# Patient Record
Sex: Male | Born: 1995 | Hispanic: No | Marital: Single | Smoking: Never smoker
Health system: Southern US, Community
[De-identification: ages and names within clinical notes are randomized; demographics above are authoritative.]

## PROBLEM LIST (undated history)

## (undated) HISTORY — PX: TEAR DUCT PROBING: SHX793

---
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
## 2015-05-07 ENCOUNTER — Encounter: Payer: Self-pay | Admitting: Emergency Medicine

## 2015-05-07 ENCOUNTER — Emergency Department
Admission: EM | Admit: 2015-05-07 | Discharge: 2015-05-07 | Disposition: A | Discharge status: Home / Self Care | Payer: 59 | Attending: Emergency Medicine | Admitting: Emergency Medicine

## 2015-05-07 ENCOUNTER — Emergency Department: Payer: 59

## 2015-05-07 DIAGNOSIS — M7989 Other specified soft tissue disorders: Secondary | ICD-10-CM | POA: Diagnosis not present

## 2015-05-07 DIAGNOSIS — Y9367 Activity, basketball: Secondary | ICD-10-CM | POA: Insufficient documentation

## 2015-05-07 DIAGNOSIS — S99912A Unspecified injury of left ankle, initial encounter: Secondary | ICD-10-CM | POA: Diagnosis present

## 2015-05-07 DIAGNOSIS — W1849XA Other slipping, tripping and stumbling without falling, initial encounter: Secondary | ICD-10-CM | POA: Diagnosis not present

## 2015-05-07 DIAGNOSIS — Y998 Other external cause status: Secondary | ICD-10-CM | POA: Insufficient documentation

## 2015-05-07 DIAGNOSIS — Y92310 Basketball court as the place of occurrence of the external cause: Secondary | ICD-10-CM | POA: Diagnosis not present

## 2015-05-07 DIAGNOSIS — S93402A Sprain of unspecified ligament of left ankle, initial encounter: Secondary | ICD-10-CM | POA: Insufficient documentation

## 2015-05-07 MED ORDER — NAPROXEN 500 MG PO TABS: 500.0000 mg | ORAL_TABLET | Freq: Two times a day (BID) | ORAL | Status: DC

## 2015-05-07 NOTE — Discharge Instructions (Signed)
Ankle Sprain °An ankle sprain is an injury to the strong, fibrous tissues (ligaments) that hold the bones of your ankle joint together.  °CAUSES °An ankle sprain is usually caused by a fall or by twisting your ankle. Ankle sprains most commonly occur when you step on the outer edge of your foot, and your ankle turns inward. People who participate in sports are more prone to these types of injuries.  °SYMPTOMS  °· Pain in your ankle. The pain may be present at rest or only when you are trying to stand or walk. °· Swelling. °· Bruising. Bruising may develop immediately or within 1 to 2 days after your injury. °· Difficulty standing or walking, particularly when turning corners or changing directions. °DIAGNOSIS  °Your caregiver will ask you details about your injury and perform a physical exam of your ankle to determine if you have an ankle sprain. During the physical exam, your caregiver will press on and apply pressure to specific areas of your foot and ankle. Your caregiver will try to move your ankle in certain ways. An X-ray exam may be done to be sure a bone was not broken or a ligament did not separate from one of the bones in your ankle (avulsion fracture).  °TREATMENT  °Certain types of braces can help stabilize your ankle. Your caregiver can make a recommendation for this. Your caregiver may recommend the use of medicine for pain. If your sprain is severe, your caregiver may refer you to a surgeon who helps to restore function to parts of your skeletal system (orthopedist) or a physical therapist. °HOME CARE INSTRUCTIONS  °· Apply ice to your injury for 1-2 days or as directed by your caregiver. Applying ice helps to reduce inflammation and pain. °· Put ice in a plastic bag. °· Place a towel between your skin and the bag. °· Leave the ice on for 15-20 minutes at a time, every 2 hours while you are awake. °· Only take over-the-counter or prescription medicines for pain, discomfort, or fever as directed by  your caregiver. °· Elevate your injured ankle above the level of your heart as much as possible for 2-3 days. °· If your caregiver recommends crutches, use them as instructed. Gradually put weight on the affected ankle. Continue to use crutches or a cane until you can walk without feeling pain in your ankle. °· If you have a plaster splint, wear the splint as directed by your caregiver. Do not rest it on anything harder than a pillow for the first 24 hours. Do not put weight on it. Do not get it wet. You may take it off to take a shower or bath. °· You may have been given an elastic bandage to wear around your ankle to provide support. If the elastic bandage is too tight (you have numbness or tingling in your foot or your foot becomes cold and blue), adjust the bandage to make it comfortable. °· If you have an air splint, you may blow more air into it or let air out to make it more comfortable. You may take your splint off at night and before taking a shower or bath. Wiggle your toes in the splint several times per day to decrease swelling. °SEEK MEDICAL CARE IF:  °· You have rapidly increasing bruising or swelling. °· Your toes feel extremely cold or you lose feeling in your foot. °· Your pain is not relieved with medicine. °SEEK IMMEDIATE MEDICAL CARE IF: °· Your toes are numb or blue. °·   You have severe pain that is increasing. MAKE SURE YOU:   Understand these instructions.  Will watch your condition.  Will get help right away if you are not doing well or get worse.   This information is not intended to replace advice given to you by your health care provider. Make sure you discuss any questions you have with your health care provider.   Document Released: 02/17/2005 Document Revised: 03/10/2014 Document Reviewed: 03/01/2011 Elsevier Interactive Patient Education 2016 Elsevier Inc.  Stirrup Ankle Brace Stirrup ankle braces give support and help stabilize the ankle joint. They are rigid pieces of  plastic or fiberglass that go up both sides of the lower leg with the bottom of the stirrup fitting comfortably under the bottom of the instep of the foot. It can be held on with Velcro straps or an elastic wrap. Stirrup ankle braces are used to support the ankle following mild or moderate sprains or strains, or fractures after cast removal.  They can be easily removed or adjusted if there is swelling. The rigid brace shells are designed to fit the ankle comfortably and provide the needed medial/lateral stabilization. This brace can be easily worn with most athletic shoes. The brace liner is usually made of a soft, comfortable gel-like material. This gel fits the ankle well without causing uncomfortable pressure points.  IMPORTANCE OF ANKLE BRACES:  The use of ankle bracing is effective in the prevention of ankle sprains.  In athletes, the use of ankle bracing will offer protection and prevent further sprains.  Research shows that a complete rehabilitation program needs to be included with external bracing. This includes range of motion and ankle strengthening exercises. Your caregivers will instruct you in this. If you were given the brace today for a new injury, use the following home care instructions as a guide. HOME CARE INSTRUCTIONS   Apply ice to the sore area for 15-20 minutes, 03-04 times per day while awake for the first 2 days. Put the ice in a plastic bag and place a towel between the bag of ice and your skin. Never place the ice pack directly on your skin. Be especially careful using ice on an elbow or knee or other bony area, such as your ankle, because icing for too long may damage the nerves which are close to the surface.  Keep your leg elevated when possible to lessen swelling.  Wear your splint until you are seen for a follow-up examination. Do not put weight on it. Do not get it wet. You may take it off to take a shower or bath.  For Activity: Use crutches with non-weight  bearing for 1 week. Then, you may walk on your ankle as instructed. Start gradually with weight bearing on the affected ankle.  Continue to use crutches or a cane until you can stand on your ankle without causing pain.  Wiggle your toes in the splint several times per day if you are able.  The splint is too tight if you have numbness, tingling, or if your foot becomes cold and blue. Adjust the straps or elastic bandage to make it comfortable.  Only take over-the-counter or prescription medicines for pain, discomfort, or fever as directed by your caregiver. SEEK IMMEDIATE MEDICAL CARE IF:   You have increased bruising, swelling or pain.  Your toes are blue or cold and loosening the brace or wrap does not help.  Your pain is not relieved with medicine. MAKE SURE YOU:   Understand these instructions.  Will watch your condition.  Will get help right away if you are not doing well or get worse.   This information is not intended to replace advice given to you by your health care provider. Make sure you discuss any questions you have with your health care provider.   Document Released: 12/19/2003 Document Revised: 05/12/2011 Document Reviewed: 09/19/2014 Elsevier Interactive Patient Education 2016 Elsevier Inc.  Adult nurselastic Bandage and RICE WHAT DOES AN ELASTIC BANDAGE DO? Elastic bandages come in different shapes and sizes. They generally provide support to your injury and reduce swelling while you are healing, but they can perform different functions. Your health care provider will help you to decide what is best for your protection, recovery, or rehabilitation following an injury. WHAT ARE SOME GENERAL TIPS FOR USING AN ELASTIC BANDAGE?  Use the bandage as directed by the maker of the bandage that you are using.  Do not wrap the bandage too tightly. This may cut off the circulation in the arm or leg in the area below the bandage.  If part of your body beyond the bandage becomes blue,  numb, cold, swollen, or is more painful, your bandage is most likely too tight. If this occurs, remove your bandage and reapply it more loosely.  See your health care provider if the bandage seems to be making your problems worse rather than better.  An elastic bandage should be removed and reapplied every 3-4 hours or as directed by your health care provider. WHAT IS RICE? The routine care of many injuries includes rest, ice, compression, and elevation (RICE therapy).  Rest Rest is required to allow your body to heal. Generally, you can resume your routine activities when you are comfortable and have been given permission by your health care provider. Ice Icing your injury helps to keep the swelling down and it reduces pain. Do not apply ice directly to your skin.  Put ice in a plastic bag.  Place a towel between your skin and the bag.  Leave the ice on for 20 minutes, 2-3 times per day. Do this for as long as you are directed by your health care provider. Compression Compression helps to keep swelling down, gives support, and helps with discomfort. Compression may be done with an elastic bandage. Elevation Elevation helps to reduce swelling and it decreases pain. If possible, your injured area should be placed at or above the level of your heart or the center of your chest. WHEN SHOULD I SEEK MEDICAL CARE? You should seek medical care if:  You have persistent pain and swelling.  Your symptoms are getting worse rather than improving. These symptoms may indicate that further evaluation or further X-rays are needed. Sometimes, X-rays may not show a small broken bone (fracture) until a number of days later. Make a follow-up appointment with your health care provider. Ask when your X-ray results will be ready. Make sure that you get your X-ray results. WHEN SHOULD I SEEK IMMEDIATE MEDICAL CARE? You should seek immediate medical care if:  You have a sudden onset of severe pain at or below  the area of your injury.  You develop redness or increased swelling around your injury.  You have tingling or numbness at or below the area of your injury that does not improve after you remove the elastic bandage.   This information is not intended to replace advice given to you by your health care provider. Make sure you discuss any questions you have with your health care provider.  Document Released: 08/09/2001 Document Revised: 11/08/2014 Document Reviewed: 10/03/2013 Elsevier Interactive Patient Education [employer]

## 2015-05-07 NOTE — ED Notes (Signed)
See triage   States he slipped rolled his left ankle today  Pain and swelling noted   Positive pulses   Good sensation

## 2015-05-07 NOTE — ED Provider Notes (Signed)
[facility] Emergency Department Provider Note  ____________________________________________  Time seen: Approximately 4:57 PM  I have reviewed the triage vital signs and the nursing notes.   HISTORY  Chief Complaint Ankle Pain    HPI William Humphrey is a 20 y.o. male who presents emergency department complaining of left ankle pain. Patient states that he was playing basketball when he "rolled his ankle." Patient is endorsing pain to the lateral aspect of his ankle with swelling. He denies any other injury or complaints at this time. He denies any numbness or tingling at the distal aspect of foot.   History reviewed. No pertinent past medical history.  There are no active problems to display for this patient.   No past surgical history on file.  Current Outpatient Rx  Name  Route  Sig  Dispense  Refill  . naproxen (NAPROSYN) 500 MG tablet   Oral   Take 1 tablet (500 mg total) by mouth 2 (two) times daily with a meal.   60 tablet   0     Allergies Review of patient's allergies indicates no known allergies.  No family history on file.  Social History Social History  Substance Use Topics  . Smoking status: Never Smoker   . Smokeless tobacco: None  . Alcohol Use: None     Review of Systems  Constitutional: No fever/chills Musculoskeletal: Negative for back pain. Positive for left ankle pain. Skin: Negative for rash. Neurological: Negative for headaches, focal weakness or numbness. 10-point ROS otherwise negative.  ____________________________________________   PHYSICAL EXAM:  VITAL SIGNS: ED Triage Vitals  Enc Vitals Group     BP 05/07/15 1558 129/63 mmHg     Pulse Rate 05/07/15 1558 67     Resp 05/07/15 1558 16     Temp 05/07/15 1558 98.2 F (36.8 C)     Temp Source 05/07/15 1558 Oral     SpO2 05/07/15 1558 97 %     Weight 05/07/15 1558 217 lb (98.431 kg)     Height 05/07/15 1558 6\' 4"  (1.93 m)     Head Cir --      Peak  Flow --      Pain Score 05/07/15 1600 0     Pain Loc --      Pain Edu? --      Excl. in GC? --      Constitutional: Alert and oriented. Well appearing and in no acute distress. Eyes: Conjunctivae are normal. PERRL. EOMI. Head: Atraumatic. Cardiovascular: Normal rate, regular rhythm. Normal S1 and S2.  Good peripheral circulation. Respiratory: Normal respiratory effort without tachypnea or retractions. Lungs CTAB. Musculoskeletal: Edema noted to the lateral aspect of the left ankle. No visible deformity. Full range of motion to ankle. Patient is nontender to palpation over the lateral malleolus. Dorsalis pedis pulses appreciated. Sensation intact 5 digits. Neurologic:  Normal speech and language. No gross focal neurologic deficits are appreciated.  Skin:  Skin is warm, dry and intact. No rash noted. Psychiatric: Mood and affect are normal. Speech and behavior are normal. Patient exhibits appropriate insight and judgement.   ____________________________________________   LABS (all labs ordered are listed, but only abnormal results are displayed)  Labs Reviewed - No data to display ____________________________________________  EKG   ____________________________________________  RADIOLOGY Festus BarrenI, Jonathan D Cuthriell, personally viewed and evaluated these images (plain radiographs) as part of my medical decision making, as well as reviewing the written report by the radiologist.  Dg Ankle Complete Left  05/07/2015  CLINICAL DATA:  Lateral ankle pain and swelling following twisting injury playing basketball yesterday. EXAM: LEFT ANKLE COMPLETE - 3+ VIEW COMPARISON:  None. FINDINGS: The mineralization and alignment are normal. There is no evidence of acute fracture or dislocation. The joint spaces are preserved. There is moderate lateral soft tissue swelling with a probable ankle joint effusion. IMPRESSION: No acute osseous findings.  Lateral soft tissue swelling. Electronically Signed   By:  Carey Bullocks M.D.   On: 05/07/2015 16:24    ____________________________________________    PROCEDURES  Procedure(s) performed:       Medications - No data to display   ____________________________________________   INITIAL IMPRESSION / ASSESSMENT AND PLAN / ED COURSE  Pertinent labs & imaging results that were available during my care of the patient were reviewed by me and considered in my medical decision making (see chart for details).  Patient's diagnosis is consistent with left ankle sprain. Patient will be discharged home with prescriptions for anti-inflammatories for symptom control. Patient is advised to use a stirrup ankle brace or playing sports in the future.. Patient is to follow up with orthopedics if symptoms persist past this treatment course. Patient is given ED precautions to return to the ED for any worsening or new symptoms.     ____________________________________________  FINAL CLINICAL IMPRESSION(S) / ED DIAGNOSES  Final diagnoses:  Ankle sprain, left, initial encounter      NEW MEDICATIONS STARTED DURING THIS VISIT:  New Prescriptions   NAPROXEN (NAPROSYN) 500 MG TABLET    Take 1 tablet (500 mg total) by mouth 2 (two) times daily with a meal.        This chart was dictated using voice recognition software/Dragon. Despite best efforts to proofread, errors can occur which can change the meaning. Any change was purely unintentional.    Racheal Patches, PA-C 05/07/15 1718  Jeanmarie Plant, MD 05/07/15 2011

## 2015-05-07 NOTE — ED Notes (Signed)
Pt states he slipped and twisted his left ankle today. Pt ambulatory to triage without difficulty.

## 2015-05-07 NOTE — ED Notes (Signed)
Left ankle pain.  Onset of symptoms yesterday.  Patient states "rolled ankle yesterday while playing basketball".

## 2015-09-18 DIAGNOSIS — Z0000 Encounter for general adult medical examination without abnormal findings: Secondary | ICD-10-CM | POA: Diagnosis not present
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
## 2016-05-07 ENCOUNTER — Ambulatory Visit (INDEPENDENT_AMBULATORY_CARE_PROVIDER_SITE_OTHER): Payer: 59

## 2016-05-07 ENCOUNTER — Encounter (INDEPENDENT_AMBULATORY_CARE_PROVIDER_SITE_OTHER): Payer: Self-pay | Admitting: Orthopedic Surgery

## 2016-05-07 ENCOUNTER — Ambulatory Visit (INDEPENDENT_AMBULATORY_CARE_PROVIDER_SITE_OTHER): Payer: 59 | Admitting: Orthopedic Surgery

## 2016-05-07 DIAGNOSIS — G8929 Other chronic pain: Secondary | ICD-10-CM

## 2016-05-07 DIAGNOSIS — M25512 Pain in left shoulder: Secondary | ICD-10-CM

## 2016-05-07 NOTE — Progress Notes (Signed)
Office Visit Note   Patient: William Humphrey           Date of Birth: 11/17/1995           MRN: [id number] Visit Date: 05/07/2016 Requested by: No referring provider defined for this encounter. PCP: Leanna SatoMILES,LINDA M, MD  Subjective: Chief Complaint  Patient presents with  . 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  He thinks it "separated" after playing basketball 3 years ago.  Patient states the shoulder feels unstable.  It hurts when he is playing basketball.  If he gets fouled or bumped it feels loose.  He reports pain circumferentially around   No issues with working out.  He is right-hand-dominant.  He is okay with bench press.              ROS: All systems reviewed are negative as they relate to the chief complaint within the history of present illness.  Patient denies  fevers or chills.   Assessment & Plan: Visit Diagnoses:  1. Chronic left shoulder pain     Plan: impression is left shoulder symptomatic instability with radiographic evidence of possible reverse bony [place].  He's not ligamentously that loose on exam.  This does not look like multidirectional instability when comparing the left and right shoulders.we need an MRI arthrogram of that left shoulder to evaluate for possible labral pathology.  I'll see him back after that study.  Follow-Up Instructions: Return for after MRI.   Orders:  Orders Placed This Encounter  Procedures  . XR Shoulder Left  . MR Shoulder Left w/ contrast  . Arthrogram   No orders of the defined types were placed in this encounter.     Procedures: No procedures performed   Clinical Data: No additional findings.  Objective: Vital Signs: There were no vitals taken for this visit.  Physical Exam:   Constitutional: Patient appears well-developed HEENT:  Head: Normocephalic Eyes:EOM are normal Neck: Normal range of  motion Cardiovascular: Normal rate Pulmonary/chest: Effort normal Neurologic: Patient is alert Skin: Skin is warm Psychiatric: Patient has normal mood and affect    Ortho Exam: orthopedic exam demonstrates full active and passive motion of the left shoulder and right shoulder with less than a centimeter sulcus sign bilaterally.  Cervical spine range of motion is full.  I really detect much in the way of anterior or posterior apprehension.  No course grinding or crepitus on labral testing left versus right.  O'Brien's testing negative on the left.  I can't really generate much posterior apprehension with adduction and forward flexion and internal rotation  Specialty Comments:  No specialty comments available.  Imaging: Xr Shoulder Left  Result Date: 05/07/2016 Left shoulder AP outlet and axillary reviewed.  There are humeral joint is reduced.  The visualized lung fields clear.  Acromiohumeral distance maintained.  Acromioclavicular joint normal.  There is calcification in the posterior inferior glenoid noted on both the AP and axillary views.    PMFS History: There are no active problems to display for this patient.  No past medical history on file.  No family history on file.  No past surgical history on file. Social History   Occupational History  . Not on file.   Social History Main Topics  . Smoking status: Never Smoker  . Smokeless tobacco: Never Used  . Alcohol use Not on file  . Drug use: Unknown  .  Sexual activity: Not on file

## 2016-05-09 DIAGNOSIS — S43432A Superior glenoid labrum lesion of left shoulder, initial encounter: Secondary | ICD-10-CM | POA: Diagnosis not present

## 2016-05-09 DIAGNOSIS — M25412 Effusion, left shoulder: Secondary | ICD-10-CM | POA: Diagnosis not present

## 2016-05-09 DIAGNOSIS — M25512 Pain in left shoulder: Secondary | ICD-10-CM | POA: Diagnosis not present

## 2016-05-09 DIAGNOSIS — M958 Other specified acquired deformities of musculoskeletal system: Secondary | ICD-10-CM | POA: Diagnosis not present

## 2016-05-09 DIAGNOSIS — M7542 Impingement syndrome of left shoulder: Secondary | ICD-10-CM | POA: Diagnosis not present

## 2016-05-14 ENCOUNTER — Telehealth (INDEPENDENT_AMBULATORY_CARE_PROVIDER_SITE_OTHER): Payer: Self-pay | Admitting: Orthopedic Surgery

## 2016-05-14 NOTE — Telephone Encounter (Signed)
Pt requested a call back to discuss MRI results.  [phone]

## 2016-05-14 NOTE — Telephone Encounter (Signed)
Spoke with patient explained Dr August Saucerean out of office until Monday. He said that he lives out of town and was told by Dr August Saucerean that he could be called with results since he lives out of town. Please advise.

## 2016-05-14 NOTE — Telephone Encounter (Signed)
Pt requested

## 2016-05-20 ENCOUNTER — Telehealth (INDEPENDENT_AMBULATORY_CARE_PROVIDER_SITE_OTHER): Payer: Self-pay | Admitting: *Deleted

## 2016-05-20 NOTE — Telephone Encounter (Signed)
Patient's mother called in this morning in regards to needing her son's MRI results. He has not heard from anyone and he would like to have these results today please if possible. His mother's CB is # [phone]. Thank you

## 2016-05-20 NOTE — Telephone Encounter (Signed)
Apologized for delay in getting results.  I advised would have Dr August Saucerean call tomorrow.

## 2016-05-21 NOTE — Telephone Encounter (Signed)
I called patient he has what appears to be an operative problem in the shoulder.  Continues to have issues with instability.  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  I did tell him that that he does have a problem which will likely require surgery in order to achieve shoulder stability.  Trade off on that I explained would be a possibility of shoulder stiffness to some degree.

## 2016-05-28 ENCOUNTER — Telehealth (INDEPENDENT_AMBULATORY_CARE_PROVIDER_SITE_OTHER): Payer: Self-pay | Admitting: Orthopedic Surgery

## 2016-05-28 NOTE — Telephone Encounter (Signed)
Spoke with patients mom and she is anxious about getting patient scheduled for surgery while he is out for summer.  Dr August Saucerean ok with patient being scheduled for June. Will give info to sheet to Taren to call and schedule for surgery.

## 2016-05-28 NOTE — Telephone Encounter (Signed)
Patient mother William Ro(Deneen) called needing to know what Dr August Saucerean want to do next for William Humphrey. She asked if Dr August Saucerean want to get [facility] scheduled for surgery or see him in the office first. Patient will be in town the last weekend in May. The number to contact Deneen is [phone]

## 2016-06-03 ENCOUNTER — Other Ambulatory Visit (INDEPENDENT_AMBULATORY_CARE_PROVIDER_SITE_OTHER): Payer: Self-pay | Admitting: Orthopedic Surgery

## 2016-06-03 DIAGNOSIS — M24112 Other articular cartilage disorders, left shoulder: Secondary | ICD-10-CM

## 2016-07-29 ENCOUNTER — Encounter (HOSPITAL_COMMUNITY): Payer: Self-pay

## 2016-07-29 ENCOUNTER — Encounter (HOSPITAL_COMMUNITY)
Admission: RE | Admit: 2016-07-29 | Discharge: 2016-07-29 | Disposition: A | Discharge status: Home / Self Care | Payer: 59 | Source: Ambulatory Visit | Attending: Orthopedic Surgery | Admitting: Orthopedic Surgery

## 2016-07-29 DIAGNOSIS — Z01812 Encounter for preprocedural laboratory examination: Secondary | ICD-10-CM | POA: Insufficient documentation

## 2016-07-29 DIAGNOSIS — X58XXXA Exposure to other specified factors, initial encounter: Secondary | ICD-10-CM | POA: Insufficient documentation

## 2016-07-29 DIAGNOSIS — S43402A Unspecified sprain of left shoulder joint, initial encounter: Secondary | ICD-10-CM | POA: Diagnosis not present

## 2016-07-29 LAB — CBC
HCT: 44.2 % (ref 39.0–52.0)
Hemoglobin: 15.2 g/dL (ref 13.0–17.0)
MCH: 29.9 pg (ref 26.0–34.0)
MCHC: 34.4 g/dL (ref 30.0–36.0)
MCV: 86.8 fL (ref 78.0–100.0)
Platelets: 232 10*3/uL (ref 150–400)
RBC: 5.09 MIL/uL (ref 4.22–5.81)
RDW: 12.4 % (ref 11.5–15.5)
WBC: 5.1 10*3/uL (ref 4.0–10.5)

## 2016-07-29 LAB — BASIC METABOLIC PANEL
Anion gap: 7 (ref 5–15)
BUN: 11 mg/dL (ref 6–20)
CO2: 29 mmol/L (ref 22–32)
Calcium: 9.5 mg/dL (ref 8.9–10.3)
Chloride: 103 mmol/L (ref 101–111)
Creatinine, Ser: 1.11 mg/dL (ref 0.61–1.24)
GFR calc Af Amer: >60 mL/min (ref >60)
GFR calc non Af Amer: >60 mL/min (ref >60)
Glucose, Bld: 98 mg/dL (ref 65–99)
Potassium: 3.8 mmol/L (ref 3.5–5.1)
Sodium: 139 mmol/L (ref 135–145)

## 2016-07-29 NOTE — Progress Notes (Signed)
ZOX:WRUEAPCP:Linda Marvis MoellerMiles, MD  Cardiologist: pt denies  EKG: pt denies ever  Stress test: pt denies ever  ECHO: pt denies ever  Cardiac Cath: pt denies ever  Chest x-ray: pt denies past year

## 2016-07-29 NOTE — Pre-Procedure Instructions (Signed)
William Humphrey  07/29/2016      [id number] - [place], [place] - [address] [place] [address] Phone: [phone] Fax: [phone]    Your procedure is scheduled on August 05, 2016.  Report to [facility] Admitting at 530 AM.  Call this number if you have problems the morning of surgery:  [phone]   Remember:  Do not eat food or drink liquids after midnight.  Take these medicines the morning of surgery with A SIP OF WATER -none.  7 days prior to surgery STOP taking any Aspirin, Aleve, Naproxen, Ibuprofen, Motrin, Advil, Goody's, BC's, all herbal medications, fish oil, and all vitamins   Do not wear jewelry, make-up or nail polish.  Do not wear lotions, powders, or perfumes, or deoderant.             Men may shave face and neck.  Do not bring valuables to the hospital.  [facility] is not responsible for any belongings or valuables.  Contacts, dentures or bridgework may not be worn into surgery.  Leave your suitcase in the car.  After surgery it may be brought to your room.  For patients admitted to the hospital, discharge time will be determined by your treatment team.  Patients discharged the day of surgery will not be allowed to drive home.   Special instructions:   - Preparing For Surgery  Before surgery, you can play an important role. Because skin is not sterile, your skin needs to be as free of germs as possible. You can reduce the number of germs on your skin by washing with CHG (chlorahexidine gluconate) Soap before surgery.  CHG is an antiseptic cleaner which kills germs and bonds with the skin to continue killing germs even after washing.  Please do not use if you have an allergy to CHG or antibacterial soaps. If your skin becomes reddened/irritated stop using the CHG.  Do not shave (including legs and underarms) for at least 48 hours prior to first CHG shower. It is OK to shave your face.  Please  follow these instructions carefully.   1. Shower the NIGHT BEFORE SURGERY and the MORNING OF SURGERY with CHG.   2. If you chose to wash your hair, wash your hair first as usual with your normal shampoo.  3. After you shampoo, rinse your hair and body thoroughly to remove the shampoo.  4. Use CHG as you would any other liquid soap. You can apply CHG directly to the skin and wash gently with a scrungie or a clean washcloth.   5. Apply the CHG Soap to your body ONLY FROM THE NECK DOWN.  Do not use on open wounds or open sores. Avoid contact with your eyes, ears, mouth and genitals (private parts). Wash genitals (private parts) with your normal soap.  6. Wash thoroughly, paying special attention to the area where your surgery will be performed.  7. Thoroughly rinse your body with warm water from the neck down.  8. DO NOT shower/wash with your normal soap after using and rinsing off the CHG Soap.  9. Pat yourself dry with a CLEAN TOWEL.   10. Wear CLEAN PAJAMAS   11. Place CLEAN SHEETS on your bed the night of your first shower and DO NOT SLEEP WITH PETS.    Day of Surgery: Do not apply any deodorants/lotions. Please wear clean clothes to the hospital/surgery center.     Please read  over the following fact sheets that you were given. Pain Booklet, Coughing and Deep Breathing, MRSA Information and Surgical Site Infection Prevention

## 2016-08-04 ENCOUNTER — Encounter (HOSPITAL_COMMUNITY): Payer: Self-pay | Admitting: Anesthesiology

## 2016-08-04 ENCOUNTER — Ambulatory Visit (INDEPENDENT_AMBULATORY_CARE_PROVIDER_SITE_OTHER): Payer: 59 | Admitting: Orthopedic Surgery

## 2016-08-04 ENCOUNTER — Encounter (INDEPENDENT_AMBULATORY_CARE_PROVIDER_SITE_OTHER): Payer: Self-pay | Admitting: Orthopedic Surgery

## 2016-08-04 DIAGNOSIS — M25512 Pain in left shoulder: Secondary | ICD-10-CM | POA: Diagnosis not present

## 2016-08-04 MED ORDER — CEFAZOLIN SODIUM-DEXTROSE 2-4 GM/100ML-% IV SOLN
2.0000 g | INTRAVENOUS | Status: AC
  Administered 2016-08-05: 2 g via INTRAVENOUS
  Filled 2016-08-04: qty 100

## 2016-08-04 NOTE — H&P (Signed)
William Humphrey is an 21 y.o. male.   Chief Complaint: Left shoulder pain and instability HPI: William Humphrey is a 21 year old patient with left shoulder pain and instability of several years duration.  Initially he sustained what sounds like an anterior dislocation which was reduced by his coach.  Since that time he's had trouble with episodic instability in the shoulder slipping into and out of joint when he is been playing basketball.  He describes no further episodes where the shoulder had to be reduced but he has been having pain with range of motion for several hours after the subluxation episode.  He has been able to lift weights.  He has not done so in several months since his season ended.  He still has several seasons left in college.  There is no family history of DVT or pulmonary embolism.  No past medical history on file.  Past Surgical History:  Procedure Laterality Date  . TOOTH EXTRACTION  2008    No family history on file. Social History:  reports that he has never smoked. He has never used smokeless tobacco. He reports that he does not drink alcohol or use drugs.  Allergies:  Allergies  Allergen Reactions  . No Known Allergies     No prescriptions prior to admission.    No results found for this or any previous visit (from the past 48 hour(s)). No results found.  Review of Systems  Musculoskeletal: Positive for joint pain.  All other systems reviewed and are negative.   There were no vitals taken for this visit. Physical Exam  Constitutional: He appears well-developed.  HENT:  Head: Normocephalic.  Eyes: Pupils are equal, round, and reactive to light.  Neck: Normal range of motion.  Cardiovascular: Normal rate.   Respiratory: Effort normal.  Neurological: He is alert.  Skin: Skin is warm.  Psychiatric: He has a normal mood and affect.   Examination of the left shoulder demonstrates full active and passive range of motion with external rotation at 15 of abduction  to about 70.  Not much in way of positive apprehension or relocation testing either anteriorly or posteriorly.  He does have some laxity to the shoulder a little bit more in the posterior direction.  There is one episode of coarseness on examination with posterior stress.  O'Brien's testing equivocal on the left negative on the right.  Cervical spine range of motion full.  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  Initial injury occurred with abduction and external rotation and full forward flexion on a rebound several years ago.  His coach reduced it.  Since that time he's had several episodes of instability when he is been playing basketball.  MRI scan shows anterior as well as posterior labral pathology along with a SLAP tear.  Plan is arthroscopy with labral repair.  Risks and benefits discussed with the patient including not limited to infection shoulder stiffness potential for recurrent dislocation as well as some loss of motion which may be the trade-off for a stable shoulder.  Patient understands the risk and benefits and wishes to proceed.  All questions answered  Burnard BuntingG Scott Ellakate Gonsalves, MD 08/04/2016, 5:14 PM

## 2016-08-04 NOTE — Anesthesia Preprocedure Evaluation (Addendum)
Anesthesia Evaluation  Patient identified by MRN, date of birth, ID band Patient awake    Reviewed: Allergy & Precautions, NPO status , Patient's Chart, lab work & pertinent test results  Airway Mallampati: I  TM Distance: >3 FB Neck ROM: Full    Dental  (+) Chipped, Dental Advisory Given, Teeth Intact,    Pulmonary neg pulmonary ROS,    breath sounds clear to auscultation       Cardiovascular negative cardio ROS   Rhythm:Regular Rate:Normal     Neuro/Psych negative neurological ROS  negative psych ROS   GI/Hepatic negative GI ROS, Neg liver ROS,   Endo/Other  negative endocrine ROS  Renal/GU negative Renal ROS  negative genitourinary   Musculoskeletal negative musculoskeletal ROS (+)   Abdominal   Peds negative pediatric ROS (+)  Hematology negative hematology ROS (+)   Anesthesia Other Findings Day of surgery medications reviewed with the patient.  Reproductive/Obstetrics negative OB ROS                           Anesthesia Physical Anesthesia Plan  ASA: I  Anesthesia Plan: General   Post-op Pain Management:  Regional for Post-op pain   Induction: Intravenous  PONV Risk Score and Plan: 2 and Ondansetron, Dexamethasone, Treatment may vary due to age, Midazolam and Propofol  Airway Management Planned: Oral ETT  Additional Equipment:   Intra-op Plan:   Post-operative Plan: Extubation in OR  Informed Consent: I have reviewed the patients History and Physical, chart, labs and discussed the procedure including the risks, benefits and alternatives for the proposed anesthesia with the patient or authorized representative who has indicated his/her understanding and acceptance.     Plan Discussed with:   Anesthesia Plan Comments:         Anesthesia Quick Evaluation

## 2016-08-05 ENCOUNTER — Ambulatory Visit (HOSPITAL_COMMUNITY): Payer: 59 | Admitting: Certified Registered"

## 2016-08-05 ENCOUNTER — Ambulatory Visit (HOSPITAL_COMMUNITY)
Admission: RE | Admit: 2016-08-05 | Discharge: 2016-08-05 | Disposition: A | Discharge status: Home / Self Care | Payer: 59 | Source: Ambulatory Visit | Attending: Orthopedic Surgery | Admitting: Orthopedic Surgery

## 2016-08-05 ENCOUNTER — Encounter (HOSPITAL_COMMUNITY): Payer: Self-pay | Admitting: *Deleted

## 2016-08-05 ENCOUNTER — Encounter (HOSPITAL_COMMUNITY)
Admission: RE | Disposition: A | Discharge status: Home / Self Care | Payer: Self-pay | Source: Ambulatory Visit | Attending: Orthopedic Surgery

## 2016-08-05 DIAGNOSIS — Y9367 Activity, basketball: Secondary | ICD-10-CM | POA: Insufficient documentation

## 2016-08-05 DIAGNOSIS — S43432A Superior glenoid labrum lesion of left shoulder, initial encounter: Secondary | ICD-10-CM | POA: Insufficient documentation

## 2016-08-05 DIAGNOSIS — S43492A Other sprain of left shoulder joint, initial encounter: Secondary | ICD-10-CM | POA: Diagnosis present

## 2016-08-05 DIAGNOSIS — S43432D Superior glenoid labrum lesion of left shoulder, subsequent encounter: Secondary | ICD-10-CM | POA: Diagnosis not present

## 2016-08-05 DIAGNOSIS — M24112 Other articular cartilage disorders, left shoulder: Secondary | ICD-10-CM

## 2016-08-05 DIAGNOSIS — G8918 Other acute postprocedural pain: Secondary | ICD-10-CM | POA: Diagnosis not present

## 2016-08-05 HISTORY — PX: SHOULDER ARTHROSCOPY WITH LABRAL REPAIR: SHX5691

## 2016-08-05 SURGERY — ARTHROSCOPY, SHOULDER, WITH GLENOID LABRUM REPAIR
Anesthesia: General | Site: Shoulder | Laterality: Left

## 2016-08-05 MED ORDER — DEXAMETHASONE SODIUM PHOSPHATE 10 MG/ML IJ SOLN
INTRAMUSCULAR | Status: AC
  Filled 2016-08-05: qty 1

## 2016-08-05 MED ORDER — DIPHENHYDRAMINE HCL 50 MG/ML IJ SOLN
INTRAMUSCULAR | Status: DC | PRN
  Administered 2016-08-05: 25 mg via INTRAVENOUS

## 2016-08-05 MED ORDER — FENTANYL CITRATE (PF) 250 MCG/5ML IJ SOLN
INTRAMUSCULAR | Status: AC
  Filled 2016-08-05: qty 5

## 2016-08-05 MED ORDER — FENTANYL CITRATE (PF) 250 MCG/5ML IJ SOLN
INTRAMUSCULAR | Status: DC | PRN
  Administered 2016-08-05 (×3): 50 ug via INTRAVENOUS

## 2016-08-05 MED ORDER — CHLORHEXIDINE GLUCONATE 4 % EX LIQD: 60.0000 mL | Freq: Once | CUTANEOUS | Status: DC

## 2016-08-05 MED ORDER — PROPOFOL 10 MG/ML IV BOLUS
INTRAVENOUS | Status: AC
  Filled 2016-08-05: qty 40

## 2016-08-05 MED ORDER — MEPERIDINE HCL 25 MG/ML IJ SOLN: 6.2500 mg | INTRAMUSCULAR | Status: DC | PRN

## 2016-08-05 MED ORDER — SUGAMMADEX SODIUM 200 MG/2ML IV SOLN
INTRAVENOUS | Status: AC
  Filled 2016-08-05: qty 2

## 2016-08-05 MED ORDER — ONDANSETRON HCL 4 MG/2ML IJ SOLN
INTRAMUSCULAR | Status: AC
  Filled 2016-08-05: qty 2

## 2016-08-05 MED ORDER — BUPIVACAINE HCL (PF) 0.25 % IJ SOLN
INTRAMUSCULAR | Status: AC
  Filled 2016-08-05: qty 30

## 2016-08-05 MED ORDER — SODIUM CHLORIDE 0.9 % IR SOLN
Status: DC | PRN
  Administered 2016-08-05: 4 mL

## 2016-08-05 MED ORDER — SUGAMMADEX SODIUM 200 MG/2ML IV SOLN
INTRAVENOUS | Status: DC | PRN
  Administered 2016-08-05: 189.6 mg via INTRAVENOUS

## 2016-08-05 MED ORDER — PROPOFOL 10 MG/ML IV BOLUS
INTRAVENOUS | Status: DC | PRN
Start: 2016-08-05 — End: 2016-08-05
  Administered 2016-08-05: 150 mg via INTRAVENOUS
  Administered 2016-08-05: 50 mg via INTRAVENOUS
  Administered 2016-08-05: 200 mg via INTRAVENOUS
  Administered 2016-08-05: 50 mg via INTRAVENOUS

## 2016-08-05 MED ORDER — SODIUM CHLORIDE 0.9 % IR SOLN
Status: DC | PRN
  Administered 2016-08-05: 12000 mL
  Administered 2016-08-05: 6000 mL

## 2016-08-05 MED ORDER — HYDROMORPHONE HCL 1 MG/ML IJ SOLN: 0.2500 mg | INTRAMUSCULAR | Status: DC | PRN

## 2016-08-05 MED ORDER — EPINEPHRINE PF 1 MG/ML IJ SOLN
INTRAMUSCULAR | Status: AC
  Filled 2016-08-05: qty 2

## 2016-08-05 MED ORDER — MIDAZOLAM HCL 2 MG/2ML IJ SOLN
INTRAMUSCULAR | Status: AC
  Filled 2016-08-05: qty 2

## 2016-08-05 MED ORDER — ONDANSETRON HCL 4 MG/2ML IJ SOLN
INTRAMUSCULAR | Status: DC | PRN
  Administered 2016-08-05: 4 mg via INTRAVENOUS

## 2016-08-05 MED ORDER — EPINEPHRINE PF 1 MG/ML IJ SOLN
INTRAMUSCULAR | Status: AC
  Filled 2016-08-05: qty 3

## 2016-08-05 MED ORDER — SUCCINYLCHOLINE CHLORIDE 200 MG/10ML IV SOSY
PREFILLED_SYRINGE | INTRAVENOUS | Status: DC | PRN
  Administered 2016-08-05: 140 mg via INTRAVENOUS

## 2016-08-05 MED ORDER — BUPIVACAINE-EPINEPHRINE (PF) 0.5% -1:200000 IJ SOLN
INTRAMUSCULAR | Status: AC
  Filled 2016-08-05: qty 30

## 2016-08-05 MED ORDER — EPINEPHRINE PF 1 MG/ML IJ SOLN
INTRAMUSCULAR | Status: AC
  Filled 2016-08-05: qty 1

## 2016-08-05 MED ORDER — DEXAMETHASONE SODIUM PHOSPHATE 10 MG/ML IJ SOLN
INTRAMUSCULAR | Status: DC | PRN
  Administered 2016-08-05: 10 mg via INTRAVENOUS

## 2016-08-05 MED ORDER — BUPIVACAINE-EPINEPHRINE (PF) 0.25% -1:200000 IJ SOLN
INTRAMUSCULAR | Status: AC
  Filled 2016-08-05: qty 30

## 2016-08-05 MED ORDER — LACTATED RINGERS IV SOLN
INTRAVENOUS | Status: DC | PRN
  Administered 2016-08-05 (×2): via INTRAVENOUS

## 2016-08-05 MED ORDER — BUPIVACAINE-EPINEPHRINE (PF) 0.5% -1:200000 IJ SOLN
INTRAMUSCULAR | Status: DC | PRN
  Administered 2016-08-05: 20 mL via PERINEURAL

## 2016-08-05 MED ORDER — LACTATED RINGERS IV SOLN: INTRAVENOUS | Status: DC

## 2016-08-05 MED ORDER — LIDOCAINE 2% (20 MG/ML) 5 ML SYRINGE
INTRAMUSCULAR | Status: AC
  Filled 2016-08-05: qty 5

## 2016-08-05 MED ORDER — PROMETHAZINE HCL 25 MG/ML IJ SOLN: 6.2500 mg | INTRAMUSCULAR | Status: DC | PRN

## 2016-08-05 MED ORDER — MIDAZOLAM HCL 5 MG/5ML IJ SOLN
INTRAMUSCULAR | Status: DC | PRN
  Administered 2016-08-05 (×2): 2 mg via INTRAVENOUS

## 2016-08-05 MED ORDER — PROPOFOL 10 MG/ML IV BOLUS
INTRAVENOUS | Status: AC
  Filled 2016-08-05: qty 20

## 2016-08-05 MED ORDER — ROCURONIUM BROMIDE 10 MG/ML (PF) SYRINGE
PREFILLED_SYRINGE | INTRAVENOUS | Status: DC | PRN
  Administered 2016-08-05: 55 mg via INTRAVENOUS
  Administered 2016-08-05 (×4): 10 mg via INTRAVENOUS
  Administered 2016-08-05: 5 mg via INTRAVENOUS

## 2016-08-05 MED ORDER — LIDOCAINE HCL (CARDIAC) 20 MG/ML IV SOLN
INTRAVENOUS | Status: DC | PRN
  Administered 2016-08-05: 60 mg via INTRAVENOUS

## 2016-08-05 MED ORDER — ROCURONIUM BROMIDE 10 MG/ML (PF) SYRINGE
PREFILLED_SYRINGE | INTRAVENOUS | Status: AC
  Filled 2016-08-05: qty 5

## 2016-08-05 SURGICAL SUPPLY — 63 items
ANCH SUT SHRT 12.5 CANN EYLT (Anchor) ×6 IMPLANT
ANCHOR SUT BIOCOMP LK 2.9X12.5 (Anchor) ×6 IMPLANT
BLADE CUTTER GATOR 3.5 (BLADE) ×1 IMPLANT
BLADE GREAT WHITE 4.2 (BLADE) ×1 IMPLANT
BLADE SURG 11 STRL SS (BLADE) ×1 IMPLANT
CANNULA 5.75X71 LONG (CANNULA) ×4 IMPLANT
CANNULA TWIST IN 8.25X7CM (CANNULA) ×2 IMPLANT
COVER SURGICAL LIGHT HANDLE (MISCELLANEOUS) ×2 IMPLANT
DRAPE INCISE IOBAN 66X45 STRL (DRAPES) ×2 IMPLANT
DRAPE STERI 35X30 U-POUCH (DRAPES) ×4 IMPLANT
DRAPE U-SHAPE 47X51 STRL (DRAPES) ×2 IMPLANT
DRSG AQUACEL AG ADV 3.5X 4 (GAUZE/BANDAGES/DRESSINGS) ×2 IMPLANT
DRSG TEGADERM 4X4.75 (GAUZE/BANDAGES/DRESSINGS) IMPLANT
DURAPREP 26ML APPLICATOR (WOUND CARE) ×2 IMPLANT
ELECT REM PT RETURN 9FT ADLT (ELECTROSURGICAL)
ELECTRODE REM PT RTRN 9FT ADLT (ELECTROSURGICAL) ×1 IMPLANT
FIBERSTICK 2 (SUTURE) ×1 IMPLANT
GAUZE SPONGE 4X4 12PLY STRL (GAUZE/BANDAGES/DRESSINGS) ×2 IMPLANT
GAUZE SPONGE 4X4 12PLY STRL LF (GAUZE/BANDAGES/DRESSINGS) ×1 IMPLANT
GAUZE XEROFORM 1X8 LF (GAUZE/BANDAGES/DRESSINGS) ×2 IMPLANT
GLOVE BIOGEL M 6.5 STRL (GLOVE) ×5 IMPLANT
GLOVE BIOGEL M 7.0 STRL (GLOVE) ×5 IMPLANT
GLOVE BIOGEL PI IND STRL 8 (GLOVE) ×1 IMPLANT
GLOVE BIOGEL PI INDICATOR 8 (GLOVE) ×1
GLOVE SURG ORTHO 8.0 STRL STRW (GLOVE) ×2 IMPLANT
GOWN STRL REUS W/ TWL LRG LVL3 (GOWN DISPOSABLE) ×3 IMPLANT
GOWN STRL REUS W/TWL LRG LVL3 (GOWN DISPOSABLE) ×6
KIT BASIN OR (CUSTOM PROCEDURE TRAY) ×2 IMPLANT
KIT DISPOSABLE PUSHLOCK 2.9MM (KITS) IMPLANT
KIT PUSHLOCK 2.9 HIP (KITS) ×1 IMPLANT
KIT ROOM TURNOVER OR (KITS) ×2 IMPLANT
LASSO 90 CVE QUICKPAS (DISPOSABLE) ×1 IMPLANT
LASSO CRESCENT QUICKPASS (SUTURE) ×2 IMPLANT
MANIFOLD NEPTUNE II (INSTRUMENTS) ×2 IMPLANT
NDL SPNL 18GX3.5 QUINCKE PK (NEEDLE) ×1 IMPLANT
NEEDLE SPNL 18GX3.5 QUINCKE PK (NEEDLE) ×4 IMPLANT
NS IRRIG 1000ML POUR BTL (IV SOLUTION) ×2 IMPLANT
PACK SHOULDER (CUSTOM PROCEDURE TRAY) ×2 IMPLANT
PAD ARMBOARD 7.5X6 YLW CONV (MISCELLANEOUS) ×4 IMPLANT
SET ARTHROSCOPY TUBING (MISCELLANEOUS) ×2
SET ARTHROSCOPY TUBING LN (MISCELLANEOUS) ×1 IMPLANT
SLEEVE ARM SUSPENSION SYSTEM (MISCELLANEOUS) ×1 IMPLANT
SLING ARM IMMOBILIZER MED (SOFTGOODS) IMPLANT
SLING S3 LATERAL DISP (MISCELLANEOUS) ×1 IMPLANT
SPONGE LAP 4X18 X RAY DECT (DISPOSABLE) ×2 IMPLANT
SUT 0 FIBERLOOP 38 BLUE TPR ND (SUTURE) ×4
SUT ETHILON 3 0 PS 1 (SUTURE) ×5 IMPLANT
SUT FIBERWIRE 2-0 18 17.9 3/8 (SUTURE)
SUT VIC AB 2-0 CT1 27 (SUTURE) ×4
SUT VIC AB 2-0 CT1 TAPERPNT 27 (SUTURE) IMPLANT
SUT VIC AB 3-0 X1 27 (SUTURE) ×2 IMPLANT
SUTURE 0 FIBERLP 38 BLU TPR ND (SUTURE) IMPLANT
SUTURE FIBERWR 2-0 18 17.9 3/8 (SUTURE) IMPLANT
SYR 10ML LL (SYRINGE) ×1 IMPLANT
SYR 20CC LL (SYRINGE) ×2 IMPLANT
SYR 30ML LL (SYRINGE) ×2 IMPLANT
TAPE LABRALWHITE 1.5X36 (TAPE) ×6 IMPLANT
TAPE SUT LABRALTAP WHT/BLK (SUTURE) IMPLANT
TOWEL OR 17X24 6PK STRL BLUE (TOWEL DISPOSABLE) ×2 IMPLANT
TOWEL OR 17X26 10 PK STRL BLUE (TOWEL DISPOSABLE) ×2 IMPLANT
WAND HAND CNTRL MULTIVAC 90 (MISCELLANEOUS) IMPLANT
WAND STAR VAC 90 (SURGICAL WAND) ×1 IMPLANT
WATER STERILE IRR 1000ML POUR (IV SOLUTION) ×2 IMPLANT

## 2016-08-05 NOTE — Transfer of Care (Signed)
Immediate Anesthesia Transfer of Care Note  Patient: William Humphrey  Procedure(s) Performed: Procedure(s): LEFT SHOULDER ARTHROSCOPY WITH LABRAL REPAIR (Left)  Patient Location: PACU  Anesthesia Type:General  Level of Consciousness: oriented, drowsy and patient cooperative  Airway & Oxygen Therapy: Patient Spontanous Breathing and Patient connected to face mask oxygen  Post-op Assessment: Report given to RN and Post -op Vital signs reviewed and stable  Post vital signs: stable  Last Vitals:  Vitals:   08/05/16 1124 08/05/16 1127  BP: (!) (P) 155/86 (!) 155/86  Pulse:  (!) 102  Resp:    Temp: (P) 37 C 37 C    Last Pain:  Vitals:   08/05/16 1127  TempSrc: Oral         Complications: No apparent anesthesia complications

## 2016-08-05 NOTE — Op Note (Signed)
NAME:  William Humphrey              ACCOUNT NO.:  [id number]  MEDICAL RECORD NO.:  [id number]  LOCATION:                                 FACILITY:  PHYSICIAN:  Burnard BuntingG. Scott Fortunata Betty, M.D.         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  PROCEDURE:  Left shoulder arthroscopy with anteroinferior labral repair and posterior labral repair.  SURGEON:  Burnard BuntingG. Scott Tarron Krolak, M.D.  ASSISTANT:  Patrick Jupiterarla Bethune, RNFA.  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  He sustained anterior dislocation a couple of years ago.  He has had symptomatic instability since that time.  MRI scan shows anteroinferior labral stripping as well as posterior labral tear.  He presents now for operative management after explanation of risks and benefits.  OPERATIVE FINDINGS: 1. Examination under anesthesia, range of motion, the patient had     about 60 degrees of external rotation at 15 degrees abduction.  He     had less than a centimeter sulcus sign bilaterally.  He did have 2+     anterior and 2+ posterior instability on the left-hand side     compared to 1+ anterior, 1+ posterior on the right-hand side.  I     could not fully dislocate him anteriorly, but he did have more     laxity anteriorly in the shoulder on the left than compared to the     right. 2. Diagnostic and operative arthroscopy:     a.     Tear of the anterior-inferior labrum with healing medially      off the anterior-inferior glenoid face.     b.     Stable anterior-superior labrum.     c.     Tear of the posterior labrum extending from the 1 o'clock      position to the 5 o'clock position in the left glenoid.     d.     Intact rotator cuff.  PROCEDURE IN DETAIL:  The patient was brought to the  operating room where general anesthetic was induced.  Preoperative antibiotics were administered.  Time-out was called.  The patient's shoulders were examined under anesthesia with the above findings.  The patient was then placed in the lateral position with the right peroneal nerve and right axilla well padded.  Arm, shoulder, and hand prescrubbed with alcohol and Betadine, allowed to air dry, prepped with DuraPrep solution and draped in a sterile manner.  Time-out was called.  The arm was then suspended with 15 pounds of traction in slight forward flexion and 45 degrees of abduction.  Superior traction also utilized in order to facilitate visualization.  At this time, posterior portal was created about 2.5 cm inferior to the posterolateral margin of the acromion.  The scope was placed.  Two anterior portals were then placed under direct visualization.  Diagnostic arthroscopy demonstrated labral detachment and medialization of the anterior band of the inferior glenohumeral ligament from the  6 o'clock to 9 o'clock position.  There was also detachment of the posterior labrum, which was more degenerative appearing in nature from the 1 o'clock to 5 o'clock position.  The anterior labrum was addressed first.  Using periosteal elevator, the plane between the previously torn anterior labrum was developed between that tissue and the glenoid.  Following development of this plane and mobilization of the tissue back onto the glenoid face, the glenoid neck was prepared using a smooth shaver and a ball rasp.  At this time, beginning inferior to posterior, 3 anchors were placed at the 7, 8, and 9 o'clock positions with good mobilization and reattachment of the labral tissue to the face.  This was done with Arthrex SutureTape and 2.9 knotless suture anchors.  Following this, attention was directed towards the posterior labrum.  A second accessory portal was created to create good drilling angle.  Three  anchors were then placed at the 1 o'clock, 3 o'clock, and 5 o'clock position after preparing the glenoid rim with a ball rasp and smooth shaver.  This gave secure fixation of the posterior labrum.  At this time, thorough irrigation of the joint was performed.  Instruments were removed, and the 4 portals were closed using 2-0 Vicryl and 3-0 nylon.  The patient was then placed into a shoulder immobilizer.  He tolerated procedure well without immediate complications, and transferred to the recovery room in stable condition.     Burnard Bunting, M.D.   ______________________________ Reece Agar. Dorene Grebe, M.D.    GSD/MEDQ  D:  08/05/2016  T:  08/05/2016  Job:  [id number]

## 2016-08-05 NOTE — Brief Op Note (Signed)
08/05/2016  11:23 AM  PATIENT:  Arcadio A Sadiq  21 y.o. male  PRE-OPERATIVE DIAGNOSIS:  LEFT SHOULDER LABRAL TEAR  POST-OPERATIVE DIAGNOSIS:  LEFT SHOULDER LABRAL TEAR  PROCEDURE:  Procedure(s): LEFT SHOULDER ARTHROSCOPY WITH LABRAL REPAIR  SURGEON:  Surgeon(s): August Saucerean, Corrie MckusickScott Gregory, MD  ASSISTANT: Patrick Jupiterarla Bethune rnfa  ANESTHESIA:   general  EBL: 5 ml    Total I/O In: 1000 [I.V.:1000] Out: -   BLOOD ADMINISTERED: none  DRAINS: none   LOCAL MEDICATIONS USED:  none  SPECIMEN:  No Specimen  COUNTS:  YES  TOURNIQUET:  * No tourniquets in log *  DICTATION: .Other Dictation: Dictation Number [id number]  PLAN OF CARE: Discharge to home after PACU  PATIENT DISPOSITION:  PACU - hemodynamically stable

## 2016-08-05 NOTE — Anesthesia Postprocedure Evaluation (Signed)
Anesthesia Post Note  Patient: William Humphrey  Procedure(s) Performed: Procedure(s) (LRB): LEFT SHOULDER ARTHROSCOPY WITH LABRAL REPAIR (Left)     Patient location during evaluation: PACU Anesthesia Type: General and Regional Level of consciousness: awake and alert Pain management: pain level controlled Vital Signs Assessment: post-procedure vital signs reviewed and stable Respiratory status: spontaneous breathing, nonlabored ventilation, respiratory function stable and patient connected to nasal cannula oxygen Cardiovascular status: blood pressure returned to baseline and stable Postop Assessment: no signs of nausea or vomiting Anesthetic complications: no    Last Vitals:  Vitals:   08/05/16 1145 08/05/16 1204  BP:  (!) 144/86  Pulse: 83 78  Resp: 20 20  Temp:      Last Pain:  Vitals:   08/05/16 1127  TempSrc: Oral                 Shelton SilvasKevin D Polly Barner

## 2016-08-05 NOTE — Anesthesia Procedure Notes (Signed)
Anesthesia Regional Block: Interscalene brachial plexus block   Pre-Anesthetic Checklist: ,, timeout performed, Correct Patient, Correct Site, Correct Laterality, Correct Procedure, Correct Position, site marked, Risks and benefits discussed,  Surgical consent,  Pre-op evaluation,  At surgeon's request and post-op pain management  Laterality: Left  Prep: chloraprep       Needles:  Injection technique: Single-shot  Needle Type: Echogenic Needle     Needle Length: 9cm  Needle Gauge: 21     Additional Needles:   Procedures: ultrasound guided,,,,,,,,  Narrative:  Start time: 08/05/2016 7:10 AM End time: 08/05/2016 7:20 AM Injection made incrementally with aspirations every 5 mL.  Performed by: Personally  Anesthesiologist: Shona SimpsonHOLLIS, Altovise Wahler D  Additional Notes: Tolerated well. No parethesias. Pt communicating throughout procedure.

## 2016-08-05 NOTE — Anesthesia Procedure Notes (Signed)
Procedure Name: Intubation Date/Time: 08/05/2016 7:56 AM Performed by: Freddie Breech Pre-anesthesia Checklist: Patient identified, Emergency Drugs available, Suction available and Patient being monitored Patient Re-evaluated:Patient Re-evaluated prior to inductionOxygen Delivery Method: Circle System Utilized Preoxygenation: Pre-oxygenation with 100% oxygen Intubation Type: IV induction Ventilation: Mask ventilation without difficulty Laryngoscope Size: Mac and 4 Grade View: Grade II Tube type: Oral Number of attempts: 1 Airway Equipment and Method: Stylet and Oral airway Placement Confirmation: ETT inserted through vocal cords under direct vision,  positive ETCO2 and breath sounds checked- equal and bilateral Secured at: 23 cm Tube secured with: Tape Dental Injury: Teeth and Oropharynx as per pre-operative assessment

## 2016-08-05 NOTE — Interval H&P Note (Signed)
History and Physical Interval Note:  08/05/2016 7:40 AM  William Humphrey  has presented today for surgery, with the diagnosis of LEFT SHOULDER LABRAL TEAR  The various methods of treatment have been discussed with the patient and family. After consideration of risks, benefits and other options for treatment, the patient has consented to  Procedure(s): LEFT SHOULDER ARTHROSCOPY WITH LABRAL REPAIR (Left) as a surgical intervention .  The patient's history has been reviewed, patient examined, no change in status, stable for surgery.  I have reviewed the patient's chart and labs.  Questions were answered to the patient's satisfaction.     Burnard BuntingG Scott Taylon Louison

## 2016-08-06 NOTE — Progress Notes (Signed)
   Office Visit Note   Patient: William Humphrey           Date of Birth: 06/15/1995           MRN: [id number] Visit Date: 08/04/2016 Requested by: Leanna SatoMiles, Linda M, MD [address] [place], [place] [address] PCP: Leanna SatoMiles, Linda M, MD  Subjective: Chief Complaint  Patient presents with  . Left Shoulder - Follow-up    HPI: William Humphrey is a 21 year old patient with left shoulder instability.  He had left shoulder dislocation about 2 years ago which was put back in by his coach.  He's been playing with shoulder subluxation since that time.  He has been able to manage it fairly well.  He's had an MRI scan done at an outside facility which shows both anterior and posterior labral pathology.  Rotator cuff is intact.  He has enough pain in the shoulder that he wants to proceed with surgical intervention.  No family history of DVT or pulmonary embolism              ROS: All systems reviewed are negative as they relate to the chief complaint within the history of present illness.  Patient denies  fevers or chills.   Assessment & Plan: Visit Diagnoses:  1. Left shoulder pain, unspecified chronicity     Plan: Impression is left shoulder instability which by history appears to be primarily anterior.  He does have some laxity on exam today but no asymmetric sulcus sign.  On both sides that is less than a centimeter.  Plan is arthroscopy with labral stabilization.  Risk and benefits are discussed with strengthening including not limited to infection or vessel damage as well as the trade-off between shoulder stability and stiffness.  He may have some loss of motion.  Patient understands the risks and benefits and wishes to proceed.  All questions answered  Follow-Up Instructions: No Follow-up on file.   Orders:  No orders of the defined types were placed in this encounter.  No orders of the defined types were placed in this encounter.     Procedures: No procedures performed   Clinical Data: No  additional findings.  Objective: Vital Signs: There were no vitals taken for this visit.  Physical Exam:   Constitutional: Patient appears well-developed HEENT:  Head: Normocephalic Eyes:EOM are normal Neck: Normal range of motion Cardiovascular: Normal rate Pulmonary/chest: Effort normal Neurologic: Patient is alert Skin: Skin is warm Psychiatric: Patient has normal mood and affect    Ortho Exam: Orthopedic exam demonstrates full forward flexion and abduction with external rotation on the left to about 60.  Motor sensory function of the hand is intact.  Cervical spine range of motion is full.  Does have more positive apprehension anteriorly than posteriorly.  O'Brien's testing equivocal on the  left negative on the right  Specialty Comments:  No specialty comments available.  Imaging: No results found.   PMFS History: There are no active problems to display for this patient.  No past medical history on file.  No family history on file.  Past Surgical History:  Procedure Laterality Date  . TOOTH EXTRACTION  2008   Social History   Occupational History  . Not on file.   Social History Main Topics  . Smoking status: Never Smoker  . Smokeless tobacco: Never Used  . Alcohol use No  . Drug use: No  . Sexual activity: Not on file

## 2016-08-07 ENCOUNTER — Encounter (HOSPITAL_COMMUNITY): Payer: Self-pay | Admitting: Orthopedic Surgery

## 2016-08-11 ENCOUNTER — Ambulatory Visit (INDEPENDENT_AMBULATORY_CARE_PROVIDER_SITE_OTHER): Payer: 59 | Admitting: Orthopedic Surgery

## 2016-08-11 ENCOUNTER — Encounter (INDEPENDENT_AMBULATORY_CARE_PROVIDER_SITE_OTHER): Payer: Self-pay | Admitting: Orthopedic Surgery

## 2016-08-11 DIAGNOSIS — M25312 Other instability, left shoulder: Secondary | ICD-10-CM

## 2016-08-13 NOTE — Progress Notes (Signed)
   Post-Op Visit Note   Patient: William Humphrey           Date of Birth: 09/03/1995           MRN: [id number] Visit Date: 08/11/2016 PCP: Leanna SatoMiles, Linda M, MD   Assessment & Plan:  Chief Complaint:  Chief Complaint  Patient presents with  . Left Shoulder - Follow-up, Routine Post Op   Visit Diagnoses: No diagnosis found.  Plan: Treatment is a 21 year old patient week out left shoulder arthroscopy with anterior and posterior labral repair.  He's been doing well.  Taking oxycodone for pain.  On exam deltoid fires in the shoulder feel stable.  No coarseness with passive range of motion.  Ligamentously sling today that fits him better 2 week return.  Okay at this point to do pendulum exercises 1 time a day only.  We'll see him back in 2 weeks we'll get him started in therapy for below shoulder level range of motion.  Follow-Up Instructions: Return in about 2 weeks (around 08/25/2016).   Orders:  No orders of the defined types were placed in this encounter.  No orders of the defined types were placed in this encounter.   Imaging: No results found.  PMFS History: There are no active problems to display for this patient.  No past medical history on file.  No family history on file.  Past Surgical History:  Procedure Laterality Date  . SHOULDER ARTHROSCOPY WITH LABRAL REPAIR Left 08/05/2016   Procedure: LEFT SHOULDER ARTHROSCOPY WITH LABRAL REPAIR;  Surgeon: Cammy Copaean, Scott Sarena Jezek, MD;  [facility];  Service: Orthopedics;  Laterality: Left;  . TOOTH EXTRACTION  2008   Social History   Occupational History  . Not on file.   Social History Main Topics  . Smoking status: Never Smoker  . Smokeless tobacco: Never Used  . Alcohol use No  . Drug use: No  . Sexual activity: Not on file

## 2016-08-25 ENCOUNTER — Ambulatory Visit (INDEPENDENT_AMBULATORY_CARE_PROVIDER_SITE_OTHER): Payer: 59 | Admitting: Orthopedic Surgery

## 2016-08-25 ENCOUNTER — Encounter (INDEPENDENT_AMBULATORY_CARE_PROVIDER_SITE_OTHER): Payer: Self-pay | Admitting: Orthopedic Surgery

## 2016-08-25 DIAGNOSIS — Z9889 Other specified postprocedural states: Secondary | ICD-10-CM

## 2016-08-27 NOTE — Progress Notes (Signed)
   Post-Op Visit Note   Patient: William Humphrey           Date of Birth: 01/07/1996           MRN: [id number] Visit Date: 08/25/2016 PCP: Leanna SatoMiles, Linda M, MD   Assessment & Plan:  Chief Complaint:  Chief Complaint  Patient presents with  . Left Shoulder - Routine Post Op   Visit Diagnoses:  1. Status post labral repair of shoulder     Plan: Treatment is now about 3 weeks out left shoulder arthroscopy with labral repair.  He's doing exercises at home.  Hasn't started formal physical therapy yet.  On exam his shoulder feel stable.  I like to keep him in the sling for another 3 weeks but start him on range of motion up to 90 of abduction and 90 of forward flexion for the next 3 weeks.  After that we can increase his range of motion to more overhead motion.  For now he just needs 3 more weeks to let the soft tissue healing to the glenoid.  Follow-Up Instructions: Return in about 3 weeks (around 09/15/2016).   Orders:  No orders of the defined types were placed in this encounter.  No orders of the defined types were placed in this encounter.   Imaging: No results found.  PMFS History: There are no active problems to display for this patient.  No past medical history on file.  No family history on file.  Past Surgical History:  Procedure Laterality Date  . SHOULDER ARTHROSCOPY WITH LABRAL REPAIR Left 08/05/2016   Procedure: LEFT SHOULDER ARTHROSCOPY WITH LABRAL REPAIR;  Surgeon: Cammy Copaean, Scott Orvin Netter, MD;  [facility];  Service: Orthopedics;  Laterality: Left;  . TOOTH EXTRACTION  2008   Social History   Occupational History  . Not on file.   Social History Main Topics  . Smoking status: Never Smoker  . Smokeless tobacco: Never Used  . Alcohol use No  . Drug use: No  . Sexual activity: Not on file

## 2016-09-08 ENCOUNTER — Ambulatory Visit: Payer: 59 | Attending: Specialist

## 2016-09-08 DIAGNOSIS — M25612 Stiffness of left shoulder, not elsewhere classified: Secondary | ICD-10-CM | POA: Insufficient documentation

## 2016-09-08 DIAGNOSIS — R293 Abnormal posture: Secondary | ICD-10-CM | POA: Diagnosis not present

## 2016-09-08 DIAGNOSIS — M6281 Muscle weakness (generalized): Secondary | ICD-10-CM | POA: Diagnosis not present

## 2016-09-08 DIAGNOSIS — Z9889 Other specified postprocedural states: Secondary | ICD-10-CM | POA: Insufficient documentation

## 2016-09-08 NOTE — Therapy (Signed)
[facility] MAIN [facility] SERVICES [address] [place], [place], [address] Phone: [phone]   Fax:  [phone]  Physical Therapy Evaluation  Patient Details  Name: William Humphrey MRN: [id number] Date of Birth: 10/07/1995 Referring Provider: Jene Every, MD  Encounter Date: 09/08/2016      PT End of Session - 09/08/16 1719    Visit Number 1   Number of Visits 12   Date for PT Re-Evaluation 10/20/16   PT Start Time 1600   PT Stop Time 1652   PT Time Calculation (min) 52 min   Activity Tolerance Patient tolerated treatment well;Treatment limited secondary to medical complications (Comment)   Behavior During Therapy [facility] for tasks assessed/performed      History reviewed. No pertinent past medical history.  Past Surgical History:  Procedure Laterality Date  . SHOULDER ARTHROSCOPY WITH LABRAL REPAIR Left 08/05/2016   Procedure: LEFT SHOULDER ARTHROSCOPY WITH LABRAL REPAIR;  Surgeon: Cammy Copa, MD;  Location: [facility] OR;  Service: Orthopedics;  Laterality: Left;  . TOOTH EXTRACTION  2008    There were no vitals filed for this visit.   PAIN: Pt. Reports no pain at this time.   POSTURE: Excessive rounded shoulders and head down, exacerbated in seated.   Observation Scars slightly raised, three noted, slight swelling of GH joint.  PROM/AROM: PROM ONLY  Left: flexion 90          Abduction 90          ER 30 Full within protocol allowable R: PROM flexion: 162, ER: full, ABD: full  L Scapular motion: Inferior superior WFL       Medial Lateral limited by swelling/pain/ guarding  STRENGTH:   Grip: R 72  L 64 MMT deferred at this time due to protocol  Isometrics performed  SENSATION: normal   OUTCOME MEASURES: QuickDash: 13.63%  TherEx Doorway isometrics 10x with 5 second holds, flexion, extension, abduction, IR.  Ball grip 10x5 second holds Scapular retractions 10x with 5 second holds  Manual: Maintained PROM at  end of allowable range per protocol, multiple trials no pain or stretch.    Objective measurements completed on examination: See above findings.         Plan - 09/08/16 1721    Clinical Impression Statement  Patient presents to therapy s/p L shoulder arthroscopy/labral tear repair. Current protocol includes  PROM below shoulder level ER < 30, ABD <90, FF <90 RC and deltoid isometrics  2x/week for 3 weeks then start above shoulder level strengthening and ROM. Patient has full allowable PROM however did not wear sling into session. Scar tissue slightly raised and joint heated, pt educated on importance of continued icing for swelling reduction. Postural education provided and scapular retractions included into HEP along with doorway isometrics and ball grip. QuickDash 13.63%. Grip strength: R 72, L64, with pt. Being right handed. Patient will benefit from skilled physical therapy to improve shoulder mobility and strength to allow to previous level of function.    History and Personal Factors relevant to plan of care: This patient presents with  1- 2 personal factors/ comorbidities  and 1-2,  body elements including body structures and functions, activity limitations and or participation restrictions. Patient's condition is stable.   Clinical Presentation Stable   Clinical Presentation due to: s/p shoulder labral surgery, hx of instability   Clinical Decision Making Low   Rehab Potential Excellent   Clinical Impairments Affecting Rehab Potential  (-) s/p surgery, adherance to wearing  sling, (+) desire to return to sport, age   PT Frequency 2x / week   PT Duration 6 weeks   PT Treatment/Interventions ADLs/Self Care Home Management;Biofeedback;Cryotherapy;Electrical Stimulation;Iontophoresis 4mg /ml Dexamethasone;Moist Heat;Ultrasound;Therapeutic activities;Functional mobility training;Therapeutic exercise;Neuromuscular re-education;Patient/family education;Manual techniques;Compression bandaging;Scar  mobilization;Passive range of motion;Dry needling;Energy conservation;Splinting;Taping;Visual/perceptual remediation/compensation   PT Next Visit Plan review HEP, ROM, posture   PT Home Exercise Plan see sheet   Consulted and Agree with Plan of Care Patient      Patient will benefit from skilled therapeutic intervention in order to improve the following deficits and impairments:  Decreased knowledge of precautions, Decreased mobility, Decreased range of motion, Decreased safety awareness, Decreased strength, Decreased scar mobility, Hypomobility, Impaired UE functional use, Improper body mechanics, Postural dysfunction  Visit Diagnosis: Stiffness of left shoulder, not elsewhere classified  Muscle weakness (generalized)  Abnormal posture     Problem List Patient Active Problem List   Diagnosis Date Noted  . History of arthroscopy of left shoulder 09/08/2016    [name], PT, DPT   [name] 09/08/2016, 5:32 PM  [facility] [address] [place], [place], [address] Phone: [phone]   Fax:  [phone]  Name: William Humphrey MRN: [id number] Date of Birth: 11/17/1995

## 2016-09-10 ENCOUNTER — Ambulatory Visit: Payer: 59

## 2016-09-10 DIAGNOSIS — M6281 Muscle weakness (generalized): Secondary | ICD-10-CM

## 2016-09-10 DIAGNOSIS — R293 Abnormal posture: Secondary | ICD-10-CM | POA: Diagnosis not present

## 2016-09-10 DIAGNOSIS — M25612 Stiffness of left shoulder, not elsewhere classified: Secondary | ICD-10-CM

## 2016-09-11 NOTE — Therapy (Signed)
[facility] [address] [place], [place], [address] Phone: [phone]   Fax:  [phone]  Physical Therapy Treatment  Patient Details  Name: William Humphrey MRN: [id number] Date of Birth: 10/30/1995 Referring Provider: Jene EveryBeane, Jeffrey, MD  Encounter Date: 09/10/2016      PT End of Session - 09/11/16 0827    Visit Number 2   Number of Visits 12   Date for PT Re-Evaluation 10/20/16   PT Start Time 1600   PT Stop Time 1645   PT Time Calculation (min) 45 min   Activity Tolerance Patient tolerated treatment well;Treatment limited secondary to medical complications (Comment)   Behavior During Therapy [facility] for tasks assessed/performed      No past medical history on file.  Past Surgical History:  Procedure Laterality Date  . SHOULDER ARTHROSCOPY WITH LABRAL REPAIR Left 08/05/2016   Procedure: LEFT SHOULDER ARTHROSCOPY WITH LABRAL REPAIR;  Surgeon: Cammy Copaean, Scott Gregory, MD;  [facility];  Service: Orthopedics;  Laterality: Left;  . TOOTH EXTRACTION  2008    There were no vitals filed for this visit.      Subjective Assessment - 09/11/16 0826    Subjective Patient wore sling into therapy session and reports compliance with HEP.    Pertinent History Pt. 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 Prior to surgery pt. sustained anterior dislocation a few years ago and had symptomatic instability since. Pt. had tera of anterior inferior labrum with tear of posterior labrum extending from the 1 oclock position to the 5 oclock position in the left glenoid with intact rotator cuff. Orders request PROM below shoulder level with ER <30, Abd <90, FF <90 and isometrics of RC and deltoid for three weeks. Pt. Is currently only doing 30 clockwise, and 30 counterclockwise pendulums. 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    Limitations Lifting;Reading;Walking;Writing;House hold activities   How long can you sit comfortably? n/a   How long can you stand comfortably? n/a   How long can you walk comfortably? 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 No/denies      Wore brace into session Manual:.  PROM with 20 second holds 10x each direction: flexion to 90, abduction to 90, ER to 30 Scar tissue massage (anterior and posterior shoulder)  TherEx Isometrics laying down 8x5 second holds with short arm and long arm in varying planes in supine (ER, Abd, Flex, Ext, IR). 75% Isometrics against door, reviewed proper form, cueing for upright posture. (ER, Abd, Flex, Ext, IR), 75% Scapular retractions 15x. Visual and tactile cueing  Verbal cueing required for isometric exercises for correct percentage contraction.         PT Long Term Goals - 09/08/16 1725      PT LONG TERM GOAL #1   Title Patient will improve shoulder ROM to > 140 degrees of flexion, scaption, and abduction for improved ability to perform overhead activities.   Baseline 7/9: pt limited to 90 flexion and abduction per protocol at this time   Time 6   Period Weeks   Status New     PT LONG TERM GOAL #2   Title Patient will decrease Quick DASH score by > 8 (  to 5%) points demonstrating reduced self-reported upper extremity disability.   Baseline 7/9: 13.63   Time 6   Period Weeks   Status New     PT LONG TERM GOAL #3   Title Patient will demonstrate LUE strength of 4+/5 to return to competative collegiate basketball    Baseline 7/9: will assess in 3 weeks   Time 6   Period Weeks   Status New     PT LONG TERM GOAL #4   Title Pt. LUE will be equal to RUE strength within 5 lbs allowing pt. to return to weight lifting regime.    Baseline 7/9:  unable to lift at this time   Time 6   Period Weeks   Status New               Plan - 09/11/16 0829    Clinical Impression Statement Patient presents to therapy wearing sling demonstrating understanding of usage of sling when in public areas. Isometrics in supine with varying positions was performed with pt. Requiring frequent verbal cueing for correct percent strength. Patient will benefit from continued skilled physical therapy to improve shoulder mobility and strength for return to previous level of function.    Rehab Potential Excellent   Clinical Impairments Affecting Rehab Potential  (-) s/p surgery, adherance to wearing sling, (+) desire to return to sport, age   PT Frequency 2x / week   PT Duration 6 weeks   PT Treatment/Interventions ADLs/Self Care Home Management;Biofeedback;Cryotherapy;Electrical Stimulation;Iontophoresis 4mg /ml Dexamethasone;Moist Heat;Ultrasound;Therapeutic activities;Functional mobility training;Therapeutic exercise;Neuromuscular re-education;Patient/family education;Manual techniques;Compression bandaging;Scar mobilization;Passive range of motion;Dry needling;Energy conservation;Splinting;Taping;Visual/perceptual remediation/compensation   PT Next Visit Plan review HEP, ROM, posture   PT Home Exercise Plan see sheet   Consulted and Agree with Plan of Care Patient      Patient will benefit from skilled therapeutic intervention in order to improve the following deficits and impairments:  Decreased knowledge of precautions, Decreased mobility, Decreased range of motion, Decreased safety awareness, Decreased strength, Decreased scar mobility, Hypomobility, Impaired UE functional use, Improper body mechanics, Postural dysfunction  Visit Diagnosis: Stiffness of left shoulder, not elsewhere classified  Muscle weakness (generalized)  Abnormal posture     Problem List Patient Active Problem List   Diagnosis Date Noted  . History of arthroscopy of left  shoulder 09/08/2016   Precious Bard, PT, DPT   Precious Bard 09/11/2016, 8:32 AM  [facility] MAIN [facility] SERVICES [address] [place], [place], [address] Phone: [phone]   Fax:  [phone]  Name: William Humphrey MRN: [id number] Date of Birth: [birth year]

## 2016-09-15 ENCOUNTER — Encounter (INDEPENDENT_AMBULATORY_CARE_PROVIDER_SITE_OTHER): Payer: Self-pay | Admitting: Orthopedic Surgery

## 2016-09-15 ENCOUNTER — Ambulatory Visit (INDEPENDENT_AMBULATORY_CARE_PROVIDER_SITE_OTHER): Payer: 59 | Admitting: Orthopedic Surgery

## 2016-09-15 DIAGNOSIS — Z9889 Other specified postprocedural states: Secondary | ICD-10-CM

## 2016-09-15 NOTE — Progress Notes (Signed)
   Post-Op Visit Note   Patient: William Humphrey           Date of Birth: 05/23/1995           MRN: [id number] Visit Date: 09/15/2016 PCP: Leanna SatoMiles, Linda M, MD   Assessment & Plan:  Chief Complaint:  Chief Complaint  Patient presents with  . Left Shoulder - Routine Post Op   Visit Diagnoses:  1. History of arthroscopy of left shoulder     Plan: William Humphrey is a 21 year old patient who is now 6 weeks out left shoulder arthroscopy with labral repair.  On exam he has good range of motion and good shoulder stability particularly anteriorly.  He's had about 50 of external rotation at the with his elbow at his side.  Plan at this time is to discontinue the sling when up start doing some overhead motion four-week return rotator cuff strength needs.  I'll see him back in 4 weeks right before he goes back to [employer].  Follow-Up Instructions: Return in about 4 weeks (around 10/13/2016).   Orders:  No orders of the defined types were placed in this encounter.  No orders of the defined types were placed in this encounter.   Imaging: No results found.  PMFS History: Patient Active Problem List   Diagnosis Date Noted  . History of arthroscopy of left shoulder 09/08/2016   No past medical history on file.  No family history on file.  Past Surgical History:  Procedure Laterality Date  . SHOULDER ARTHROSCOPY WITH LABRAL REPAIR Left 08/05/2016   Procedure: LEFT SHOULDER ARTHROSCOPY WITH LABRAL REPAIR;  Surgeon: Cammy Copaean, Scott Gregory, MD;  [facility];  Service: Orthopedics;  Laterality: Left;  . TOOTH EXTRACTION  2008   Social History   Occupational History  . Not on file.   Social History Main Topics  . Smoking status: Never Smoker  . Smokeless tobacco: Never Used  . Alcohol use No  . Drug use: No  . Sexual activity: Not on file

## 2016-09-16 ENCOUNTER — Ambulatory Visit: Payer: 59

## 2016-09-16 DIAGNOSIS — M6281 Muscle weakness (generalized): Secondary | ICD-10-CM | POA: Diagnosis not present

## 2016-09-16 DIAGNOSIS — R293 Abnormal posture: Secondary | ICD-10-CM | POA: Diagnosis not present

## 2016-09-16 DIAGNOSIS — M25612 Stiffness of left shoulder, not elsewhere classified: Secondary | ICD-10-CM

## 2016-09-16 NOTE — Patient Instructions (Signed)
AAROM with cane/dowel  Flexion 10x10 second holds  Abduction 10x10 second holds  External rotation 10x10 second holds  Bench press 10x

## 2016-09-16 NOTE — Therapy (Signed)
[facility] [address] [place], [place], [address] Phone: [phone]   Fax:  [phone]  Physical Therapy Treatment  Patient Details  Name: William Humphrey MRN: [id number] Date of Birth: 10/29/1995 Referring Provider: Jene EveryBeane, Jeffrey, MD  Encounter Date: 09/16/2016      PT End of Session - 09/16/16 2131    Visit Number 3   Number of Visits 12   Date for PT Re-Evaluation 10/20/16   PT Start Time 1600   PT Stop Time 1645   PT Time Calculation (min) 45 min   Activity Tolerance Patient tolerated treatment well   Behavior During Therapy [facility] for tasks assessed/performed      History reviewed. No pertinent past medical history.  Past Surgical History:  Procedure Laterality Date  . SHOULDER ARTHROSCOPY WITH LABRAL REPAIR Left 08/05/2016   Procedure: LEFT SHOULDER ARTHROSCOPY WITH LABRAL REPAIR;  Surgeon: Cammy Copaean, Scott Gregory, MD;  [facility];  Service: Orthopedics;  Laterality: Left;  . TOOTH EXTRACTION  2008    There were no vitals filed for this visit.      Subjective Assessment - 09/16/16 2129    Subjective Pt. went to see doctor and is now allowed overhead movement with LUE. Patient reports no longer having to wear sling   Pertinent History Pt. 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 Prior to surgery pt. sustained anterior dislocation a few years ago and had symptomatic instability since. Pt. had tera of anterior inferior labrum with tear of posterior labrum extending from the 1 oclock position to the 5 oclock position in the left glenoid with intact rotator cuff. Orders request PROM below shoulder level with ER <30, Abd <90, FF <90 and isometrics of RC and deltoid for three weeks. Pt. Is currently only doing 30 clockwise, and 30 counterclockwise pendulums. 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    Limitations Lifting;Reading;Walking;Writing;House hold activities   How long can you sit comfortably? n/a   How long can you stand comfortably? n/a   How long can you walk comfortably? 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 No/denies     Manual:.  PROM with 10x10 second hold  each direction:allowed overhead motion now. Restricted with abduction most ~90 , flexion restricted, ER functional rythmic pertubation's 2x60 seconds    TherEx AAROM flexion, abduction, ER, bench press with cane 10x10 seconds Isometrics laying down 10x5 second holds with short arm and long arm in varying planes in supine (ER, Abd, Flex, Ext, IR). 75% Scapular retractions 15x. Visual and tactile cueing   Verbal cueing required for isometric exercises for correct percentage contraction.     Pt. response to medical necessity: Patient will continue to benefit from skilled physical therapy to improve shoulder mobility and strength for return to previous level of function.       PT Long Term Goals - 09/08/16 1725      PT LONG TERM GOAL #1   Title Patient will improve shoulder ROM to > 140 degrees of flexion, scaption, and abduction for improved ability to perform overhead activities.   Baseline 7/9: pt limited to 90 flexion and abduction per protocol at this time   Time 6   Period  Weeks   Status New     PT LONG TERM GOAL #2   Title Patient will decrease Quick DASH score by > 8 (to 5%) points demonstrating reduced self-reported upper extremity disability.   Baseline 7/9: 13.63   Time 6   Period Weeks   Status New     PT LONG TERM GOAL #3   Title Patient will demonstrate LUE strength of 4+/5 to return to competative collegiate basketball    Baseline 7/9: will assess in 3 weeks   Time 6   Period Weeks    Status New     PT LONG TERM GOAL #4   Title Pt. LUE will be equal to RUE strength within 5 lbs allowing pt. to return to weight lifting regime.    Baseline 7/9: unable to lift at this time   Time 6   Period Weeks   Status New               Plan - 09/16/16 2133    Clinical Impression Statement Patient has now been authorized for overhead LUE ROM. Abduction is most limited with patient stuck ~90 degrees, flexion less limited however still not upon full range yet at this time. AAROM HEP prescribed and patient demonstrated understanding. Patient will continue to benefit from skilled physical therapy to improve shoulder mobility and strength for return to previous level of function.    Rehab Potential Excellent   Clinical Impairments Affecting Rehab Potential  (-) s/p surgery, adherance to wearing sling, (+) desire to return to sport, age   PT Frequency 2x / week   PT Duration 6 weeks   PT Treatment/Interventions ADLs/Self Care Home Management;Biofeedback;Cryotherapy;Electrical Stimulation;Iontophoresis 4mg /ml Dexamethasone;Moist Heat;Ultrasound;Therapeutic activities;Functional mobility training;Therapeutic exercise;Neuromuscular re-education;Patient/family education;Manual techniques;Compression bandaging;Scar mobilization;Passive range of motion;Dry needling;Energy conservation;Splinting;Taping;Visual/perceptual remediation/compensation   PT Next Visit Plan AAROM, PROM   PT Home Exercise Plan see sheet   Consulted and Agree with Plan of Care Patient      Patient will benefit from skilled therapeutic intervention in order to improve the following deficits and impairments:  Decreased knowledge of precautions, Decreased mobility, Decreased range of motion, Decreased safety awareness, Decreased strength, Decreased scar mobility, Hypomobility, Impaired UE functional use, Improper body mechanics, Postural dysfunction  Visit Diagnosis: Stiffness of left shoulder, not elsewhere  classified  Muscle weakness (generalized)  Abnormal posture     Problem List Patient Active Problem List   Diagnosis Date Noted  . History of arthroscopy of left shoulder 09/08/2016   Precious Bard, PT, DPT   Precious Bard 09/16/2016, 9:34 PM  [facility] MAIN [facility] SERVICES [address] [place], [place], [address] Phone: [phone]   Fax:  [phone]  Name: William Humphrey MRN: [id number] Date of Birth: 20-Mar-1995

## 2016-09-18 ENCOUNTER — Ambulatory Visit: Payer: 59

## 2016-09-18 DIAGNOSIS — M25612 Stiffness of left shoulder, not elsewhere classified: Secondary | ICD-10-CM | POA: Diagnosis not present

## 2016-09-18 DIAGNOSIS — R293 Abnormal posture: Secondary | ICD-10-CM | POA: Diagnosis not present

## 2016-09-18 DIAGNOSIS — M6281 Muscle weakness (generalized): Secondary | ICD-10-CM

## 2016-09-18 NOTE — Therapy (Signed)
[facility] MAIN [facility] [address] [place], [place], [address] Phone: [phone]   Fax:  [phone]  Physical Therapy Treatment  Patient Details  Name: William Humphrey MRN: [id number] Date of Birth: 02/09/1996 Referring Provider: Jene EveryBeane, Jeffrey, MD  Encounter Date: 09/18/2016      PT End of Session - 09/18/16 1752    Visit Number 4   Number of Visits 12   Date for PT Re-Evaluation 10/20/16   PT Start Time 1554   PT Stop Time 1641   PT Time Calculation (min) 47 min   Activity Tolerance Patient tolerated treatment well   Behavior During Therapy [facility] for tasks assessed/performed      History reviewed. No pertinent past medical history.  Past Surgical History:  Procedure Laterality Date  . SHOULDER ARTHROSCOPY WITH LABRAL REPAIR Left 08/05/2016   Procedure: LEFT SHOULDER ARTHROSCOPY WITH LABRAL REPAIR;  Surgeon: Cammy Copaean, Scott Gregory, MD;  [facility];  Service: Orthopedics;  Laterality: Left;  . TOOTH EXTRACTION  2008    There were no vitals filed for this visit.      Subjective Assessment - 09/18/16 1751    Subjective Patient has been compliant with new HEP. Has been relaxing at home.    Pertinent History Pt. 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 Prior to surgery pt. sustained anterior dislocation a few years ago and had symptomatic instability since. Pt. had tera of anterior inferior labrum with tear of posterior labrum extending from the 1 oclock position to the 5 oclock position in the left glenoid with intact rotator cuff. Orders request PROM below shoulder level with ER <30, Abd <90, FF <90 and isometrics of RC and deltoid for three weeks. Pt. Is currently only doing 30 clockwise, and 30 counterclockwise pendulums. Patient to return to playing competitive basketball at [employer]  [place].    Limitations Lifting;Reading;Walking;Writing;House hold activities   How long can you sit comfortably? n/a   How long can you stand comfortably? n/a   How long can you walk comfortably? 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 No/denies      Manual:.  PROM with 10x10 second hold  each direction:allowed overhead motion now. Restricted with abduction most ~90 , flexion restricted, ER functional rythmic pertubation's 2x60 seconds     TherEx PNF D1 and D2, first assisted x15, independent x15, against PT resistance 12x AAROM flexion, abduction, ER, bench press with cane 10x10 seconds Isometrics laying down 10x5 second holds with short arm and long arm in varying planes in supine (ER, Abd, Flex, Ext, IR). 75% Scapular retractions 15x. Visual and tactile cueing   Verbal cueing required for isometric exercises for correct percentage contraction.    Pt. response to medical necessity: Patient will continue to benefit from skilled physical therapy to improve shoulder mobility and strength for return to previous level of function.          PT Long Term Goals - 09/08/16 1725      PT LONG TERM GOAL #1   Title Patient will improve shoulder ROM to > 140 degrees of flexion, scaption, and abduction for improved ability to perform overhead activities.   Baseline 7/9: pt limited to 90 flexion and abduction per protocol at  this time   Time 6   Period Weeks   Status New     PT LONG TERM GOAL #2   Title Patient will decrease Quick DASH score by > 8 (to 5%) points demonstrating reduced self-reported upper extremity disability.   Baseline 7/9: 13.63   Time 6   Period Weeks   Status New     PT LONG TERM GOAL #3   Title Patient will demonstrate LUE strength of 4+/5 to return to competative collegiate basketball    Baseline 7/9: will assess in 3  weeks   Time 6   Period Weeks   Status New     PT LONG TERM GOAL #4   Title Pt. LUE will be equal to RUE strength within 5 lbs allowing pt. to return to weight lifting regime.    Baseline 7/9: unable to lift at this time   Time 6   Period Weeks   Status New               Plan - 09/18/16 1752    Clinical Impression Statement Patient improving AAROM ROM with minor cueing for hand positioning. PNF introduced with patient initially performing with assistance, then independent, then against PT resistance while maintaining proper form throughout. Isometrics continue to progress with strength. Patient will continue to benefit from skilled physical therapy to improve shoulder mobility and strength for return to previous level of function.    Rehab Potential Excellent   Clinical Impairments Affecting Rehab Potential  (-) s/p surgery, adherance to wearing sling, (+) desire to return to sport, age   PT Frequency 2x / week   PT Duration 6 weeks   PT Treatment/Interventions ADLs/Self Care Home Management;Biofeedback;Cryotherapy;Electrical Stimulation;Iontophoresis 4mg /ml Dexamethasone;Moist Heat;Ultrasound;Therapeutic activities;Functional mobility training;Therapeutic exercise;Neuromuscular re-education;Patient/family education;Manual techniques;Compression bandaging;Scar mobilization;Passive range of motion;Dry needling;Energy conservation;Splinting;Taping;Visual/perceptual remediation/compensation   PT Next Visit Plan prone extension and horizontal abduction   PT Home Exercise Plan see sheet   Consulted and Agree with Plan of Care Patient      Patient will benefit from skilled therapeutic intervention in order to improve the following deficits and impairments:  Decreased knowledge of precautions, Decreased mobility, Decreased range of motion, Decreased safety awareness, Decreased strength, Decreased scar mobility, Hypomobility, Impaired UE functional use, Improper body mechanics, Postural  dysfunction  Visit Diagnosis: Stiffness of left shoulder, not elsewhere classified  Muscle weakness (generalized)  Abnormal posture     Problem List Patient Active Problem List   Diagnosis Date Noted  . History of arthroscopy of left shoulder 09/08/2016   Precious Bard, PT, DPT   Precious Bard 09/18/2016, 5:54 PM  [place] [facility] MAIN [facility] SERVICES [address] [place], [place], [address] Phone: [phone]   Fax:  [phone]  Name: William Humphrey MRN: [id number] Date of Birth: May 16, 1995

## 2016-09-23 ENCOUNTER — Ambulatory Visit: Payer: 59

## 2016-09-23 DIAGNOSIS — R293 Abnormal posture: Secondary | ICD-10-CM | POA: Diagnosis not present

## 2016-09-23 DIAGNOSIS — M6281 Muscle weakness (generalized): Secondary | ICD-10-CM | POA: Diagnosis not present

## 2016-09-23 DIAGNOSIS — M25612 Stiffness of left shoulder, not elsewhere classified: Secondary | ICD-10-CM | POA: Diagnosis not present

## 2016-09-23 NOTE — Therapy (Signed)
[facility] MAIN [facility] SERVICES [address] [place], [place], [address] Phone: [phone]   Fax:  [phone]  Physical Therapy Treatment  Patient Details  Name: William Humphrey MRN: [id number] Date of Birth: August 14, 1995 Referring Provider: Jene Every, MD  Encounter Date: 09/23/2016      PT End of Session - 09/24/16 0850    Visit Number 5   Number of Visits 12   Date for PT Re-Evaluation 10/20/16   PT Start Time 1559   PT Stop Time 1645   PT Time Calculation (min) 46 min   Activity Tolerance Patient tolerated treatment well   Behavior During Therapy [facility] for tasks assessed/performed      History reviewed. No pertinent past medical history.  Past Surgical History:  Procedure Laterality Date  . SHOULDER ARTHROSCOPY WITH LABRAL REPAIR Left 08/05/2016   Procedure: LEFT SHOULDER ARTHROSCOPY WITH LABRAL REPAIR;  Surgeon: Cammy Copa, MD;  [facility];  Service: Orthopedics;  Laterality: Left;  . TOOTH EXTRACTION  2008    There were no vitals filed for this visit.      Subjective Assessment - 09/24/16 0849    Subjective Patient continues to be compliant with HEP and is ready to continue progressing. Presented with glasses on today as the weather has been bothering him.    Pertinent History Pt. 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 Prior to surgery pt. sustained anterior dislocation a few years ago and had symptomatic instability since. Pt. had tera of anterior inferior labrum with tear of posterior labrum extending from the 1 oclock position to the 5 oclock position in the left glenoid with intact rotator cuff. Orders request PROM below shoulder level with ER <30, Abd <90, FF <90 and isometrics of RC and deltoid for three weeks. Pt. Is currently only doing 30 clockwise, and 30 counterclockwise  pendulums. 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    Limitations Lifting;Reading;Walking;Writing;House hold activities   How long can you sit comfortably? n/a   How long can you stand comfortably? n/a   How long can you walk comfortably? 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 No/denies       Manual:.  PROM with 10x10 second hold each direction: allowed overhead motion now. Restricted with abduction most ~90 , flexion restricted, ER functional rhythmic perturbation's 2x60 seconds     TherEx PNF D1 and D2, first assisted x15, independent x15, against PT resistance 12x AAROM flexion, abduction, ER, bench press with cane 10x10 seconds Isometrics laying down 10x5 second holds with short arm and long arm in varying planes in supine (ER, Abd, Flex, Ext, IR). 75% Scapular retractions 15x. Visual and tactile cueing Scapular punches 2lb weight x10, 4lb weight x 10  Prone shoulder extension and horizontal abduction over swiss ball x10 each Verbal cueing required for isometric exercises for correct percentage contraction.     Pt. response to medical necessity: Patient will continue to benefit from skilled physical therapy to improve shoulder mobility and strength for return to previous level of function.           PT Long Term Goals - 09/08/16 1725      PT LONG TERM GOAL #1   Title  Patient will improve shoulder ROM to > 140 degrees of flexion, scaption, and abduction for improved ability to perform overhead activities.   Baseline 7/9: pt limited to 90 flexion and abduction per protocol at this time   Time 6   Period Weeks   Status New     PT LONG TERM GOAL #2   Title Patient will decrease Quick DASH score by > 8 (to 5%) points demonstrating reduced self-reported upper extremity disability.   Baseline  7/9: 13.63   Time 6   Period Weeks   Status New     PT LONG TERM GOAL #3   Title Patient will demonstrate LUE strength of 4+/5 to return to competative collegiate basketball    Baseline 7/9: will assess in 3 weeks   Time 6   Period Weeks   Status New     PT LONG TERM GOAL #4   Title Pt. LUE will be equal to RUE strength within 5 lbs allowing pt. to return to weight lifting regime.    Baseline 7/9: unable to lift at this time   Time 6   Period Weeks   Status New               Plan - 09/24/16 [id number]    Clinical Impression Statement Patient continues to progress with functional ROM and strength. Prone strengthening exercises introduced with patient demonstrating good body mechanics. Weight introduced with scapular punches with good control and velocity. Patient will continue to benefit from skilled physical therapy to improve shoulder mobility and strength for return to previous level of function.    Rehab Potential Excellent   Clinical Impairments Affecting Rehab Potential  (-) s/p surgery, adherance to wearing sling, (+) desire to return to sport, age   PT Frequency 2x / week   PT Duration 6 weeks   PT Treatment/Interventions ADLs/Self Care Home Management;Biofeedback;Cryotherapy;Electrical Stimulation;Iontophoresis 4mg /ml Dexamethasone;Moist Heat;Ultrasound;Therapeutic activities;Functional mobility training;Therapeutic exercise;Neuromuscular re-education;Patient/family education;Manual techniques;Compression bandaging;Scar mobilization;Passive range of motion;Dry needling;Energy conservation;Splinting;Taping;Visual/perceptual remediation/compensation   PT Next Visit Plan prone extension and horizontal abduction   PT Home Exercise Plan see sheet   Consulted and Agree with Plan of Care Patient      Patient will benefit from skilled therapeutic intervention in order to improve the following deficits and impairments:  Decreased knowledge of precautions, Decreased mobility,  Decreased range of motion, Decreased safety awareness, Decreased strength, Decreased scar mobility, Hypomobility, Impaired UE functional use, Improper body mechanics, Postural dysfunction  Visit Diagnosis: Stiffness of left shoulder, not elsewhere classified  Muscle weakness (generalized)  Abnormal posture     Problem List Patient Active Problem List   Diagnosis Date Noted  . History of arthroscopy of left shoulder 09/08/2016   Precious BardMarina Rumi Taras, PT, DPT   Precious BardMarina Shatika Grinnell 09/24/2016, 8:52 AM  [place] [facility] [address] [place], [place], [address] Phone: [phone]   Fax:  [phone]  Name: William Humphrey MRN: [id number] Date of Birth: 12/18/1995

## 2016-09-25 ENCOUNTER — Ambulatory Visit: Payer: 59

## 2016-09-25 DIAGNOSIS — R293 Abnormal posture: Secondary | ICD-10-CM | POA: Diagnosis not present

## 2016-09-25 DIAGNOSIS — M6281 Muscle weakness (generalized): Secondary | ICD-10-CM

## 2016-09-25 DIAGNOSIS — M25612 Stiffness of left shoulder, not elsewhere classified: Secondary | ICD-10-CM

## 2016-09-25 NOTE — Therapy (Signed)
[facility] [facility] [address] [place], [place], [address] Phone: [phone]   Fax:  [phone]  Physical Therapy Treatment  Patient Details  Name: William Humphrey MRN: [id number] Date of Birth: 08/26/1995 Referring Provider: Jene EveryBeane, Jeffrey, MD  Encounter Date: 09/25/2016      PT End of Session - 09/25/16 1748    Visit Number 6   Number of Visits 12   Date for PT Re-Evaluation 10/20/16   PT Start Time 1600   PT Stop Time 1645   PT Time Calculation (min) 45 min   Activity Tolerance Patient tolerated treatment well   Behavior During Therapy [facility] for tasks assessed/performed      History reviewed. No pertinent past medical history.  Past Surgical History:  Procedure Laterality Date  . SHOULDER ARTHROSCOPY WITH LABRAL REPAIR Left 08/05/2016   Procedure: LEFT SHOULDER ARTHROSCOPY WITH LABRAL REPAIR;  Surgeon: Cammy Copaean, Scott Gregory, MD;  [facility];  Service: Orthopedics;  Laterality: Left;  . TOOTH EXTRACTION  2008    There were no vitals filed for this visit.      Subjective Assessment - 09/25/16 1611    Subjective Patient continues to be compliant with HEP. Starting to attempt more motions with arm.    Pertinent History Pt. 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 Prior to surgery pt. sustained anterior dislocation a few years ago and had symptomatic instability since. Pt. had tera of anterior inferior labrum with tear of posterior labrum extending from the 1 oclock position to the 5 oclock position in the left glenoid with intact rotator cuff. Orders request PROM below shoulder level with ER <30, Abd <90, FF <90 and isometrics of RC and deltoid for three weeks. Pt. Is currently only doing 30 clockwise, and 30 counterclockwise pendulums. 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    Limitations Lifting;Reading;Walking;Writing;House hold activities   How long can you sit comfortably? n/a   How long can you stand comfortably? n/a   How long can you walk comfortably? 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 No/denies      Manual:.  PROM with 10x20 second hold each direction: allowed overhead motion now. rhythmic perturbation's 2x60 seconds     TherEx Rows 2x 10 with 1lb bar, cues for keeping elbows in Seated Wall slides flexion 8x abd 6x T Band IR, ER, 2.5 on machine, Green theraband  AAROM flexion, abduction, ER, bench press with cane 10x10 seconds Scapular retractions 15x. Visual and tactile cueing Scapular punches 5lb weight x 2x20 Prone shoulder extension and horizontal abduction over swiss ball x10 each     Pt. response to medical necessity: Patient will continue to benefit from skilled physical therapy to improve shoulder mobility and strength for return to previous level of function.            PT Education - 09/25/16 1748    Education provided Yes   Education Details additional HEP   Person(s) Educated Patient   Methods Explanation;Handout;Demonstration   Comprehension Verbalized understanding;Returned demonstration             PT Long Term Goals - 09/08/16 1725      PT LONG TERM GOAL #1  Title Patient will improve shoulder ROM to > 140 degrees of flexion, scaption, and abduction for improved ability to perform overhead activities.   Baseline 7/9: pt limited to 90 flexion and abduction per protocol at this time   Time 6   Period Weeks   Status New     PT LONG TERM GOAL #2   Title Patient will decrease Quick DASH score by > 8 (to 5%) points demonstrating reduced self-reported upper extremity disability.   Baseline 7/9: 13.63   Time 6   Period Weeks   Status New      PT LONG TERM GOAL #3   Title Patient will demonstrate LUE strength of 4+/5 to return to competative collegiate basketball    Baseline 7/9: will assess in 3 weeks   Time 6   Period Weeks   Status New     PT LONG TERM GOAL #4   Title Pt. LUE will be equal to RUE strength within 5 lbs allowing pt. to return to weight lifting regime.    Baseline 7/9: unable to lift at this time   Time 6   Period Weeks   Status New               Plan - 09/25/16 1748    Clinical Impression Statement Patient continues to progress with functional range and strength. T band IR, ER implemented with additional rows and wall walks. Patient required minimal cueing for body mechanics and did not have noted scapular compensation. Abduction continues to be most limited motion at this time but is progressing as noted with wall walks. Patient will continue to benefit from skilled physical therapy to improve shoulder mobility and strength for return to previous level of function.    Rehab Potential Excellent   Clinical Impairments Affecting Rehab Potential  (-) s/p surgery, adherance to wearing sling, (+) desire to return to sport, age   PT Frequency 2x / week   PT Duration 6 weeks   PT Treatment/Interventions ADLs/Self Care Home Management;Biofeedback;Cryotherapy;Electrical Stimulation;Iontophoresis 4mg /ml Dexamethasone;Moist Heat;Ultrasound;Therapeutic activities;Functional mobility training;Therapeutic exercise;Neuromuscular re-education;Patient/family education;Manual techniques;Compression bandaging;Scar mobilization;Passive range of motion;Dry needling;Energy conservation;Splinting;Taping;Visual/perceptual remediation/compensation   PT Next Visit Plan T band IR, ER   PT Home Exercise Plan see sheet   Consulted and Agree with Plan of Care Patient      Patient will benefit from skilled therapeutic intervention in order to improve the following deficits and impairments:  Decreased knowledge of precautions,  Decreased mobility, Decreased range of motion, Decreased safety awareness, Decreased strength, Decreased scar mobility, Hypomobility, Impaired UE functional use, Improper body mechanics, Postural dysfunction  Visit Diagnosis: Stiffness of left shoulder, not elsewhere classified  Muscle weakness (generalized)  Abnormal posture     Problem List Patient Active Problem List   Diagnosis Date Noted  . History of arthroscopy of left shoulder 09/08/2016   Precious BardMarina Tru Leopard, PT, DPT   Precious BardMarina Kaylob Wallen 09/25/2016, 5:51 PM  [place] [facility] [address] [place], [place], [address] Phone: [phone]   Fax:  [phone]  Name: William Humphrey MRN: [id number] Date of Birth: 06/28/1995

## 2016-09-30 ENCOUNTER — Ambulatory Visit: Payer: 59

## 2016-09-30 DIAGNOSIS — M25612 Stiffness of left shoulder, not elsewhere classified: Secondary | ICD-10-CM

## 2016-09-30 DIAGNOSIS — M6281 Muscle weakness (generalized): Secondary | ICD-10-CM | POA: Diagnosis not present

## 2016-09-30 DIAGNOSIS — R293 Abnormal posture: Secondary | ICD-10-CM

## 2016-09-30 NOTE — Therapy (Signed)
[facility] [address] [place], [place], [address] Phone: [phone]   Fax:  [phone]  Physical Therapy Treatment  Patient Details  Name: William Humphrey A Sabol MRN: [id number] Date of Birth: 03/06/1995 Referring Provider: Jene EveryBeane, Jeffrey, MD  Encounter Date: 09/30/2016      PT End of Session - 09/30/16 1746    Visit Number 7   Number of Visits 12   Date for PT Re-Evaluation 10/20/16   PT Start Time 1600   PT Stop Time 1645   PT Time Calculation (min) 45 min   Activity Tolerance Patient tolerated treatment well   Behavior During Therapy [facility] for tasks assessed/performed      History reviewed. No pertinent past medical history.  Past Surgical History:  Procedure Laterality Date  . SHOULDER ARTHROSCOPY WITH LABRAL REPAIR Left 08/05/2016   Procedure: LEFT SHOULDER ARTHROSCOPY WITH LABRAL REPAIR;  Surgeon: Cammy Copaean, Scott Gregory, MD;  [facility];  Service: Orthopedics;  Laterality: Left;  . TOOTH EXTRACTION  2008    There were no vitals filed for this visit.      Subjective Assessment - 09/30/16 1611    Subjective Patient has been compliant with HEP. Been moving more with bilateral arms. Felt a little sore after last session but resolved.    Pertinent History Pt. 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 Prior to surgery pt. sustained anterior dislocation a few years ago and had symptomatic instability since. Pt. had tera of anterior inferior labrum with tear of posterior labrum extending from the 1 oclock position to the 5 oclock position in the left glenoid with intact rotator cuff. Orders request PROM below shoulder level with ER <30, Abd <90, FF <90 and isometrics of RC and deltoid for three weeks. Pt. Is currently only doing 30 clockwise, and 30 counterclockwise pendulums. Patient to  return to playing competitive basketball at 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    Limitations Lifting;Reading;Walking;Writing;House hold activities   How long can you sit comfortably? n/a   How long can you stand comfortably? n/a   How long can you walk comfortably? 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 No/denies     Manual:.  PROM with 10x20 second hold each direction: allowed overhead motion now. rhythmic perturbation's 2x60 seconds     TherEx Seated Wall walks flexion 8x abd 6x Scapular punches 7lb dumbbell 2x15 Prone shoulder extension and horizontal abduction over swiss ball x10 each Bent over rows 5lb bar 2x15, overhand, underhand Arm raises 21. (7x7x7)x2 for deltoids  Matrix machine: Straight arm lat pull down 7.5lb  10x 12.5 10x  tricep pulldown 7.5lb 2x10 Bicep curl 7.5 lb 2x15 T Band IR, ER, 7.5 on machine, 2x15, cues for decreased velocity, ER harder than IR,      Pt. response to medical necessity: Patient will continue to benefit from skilled physical therapy to improve shoulder mobility and strength for return to previous level of function.             PT Education - 09/30/16 1612    Education provided Yes   Education Details Estate manager/land agentbody mechanics for strengthening interventions   Person(s) Educated Patient   Methods Explanation;Demonstration;Verbal cues;Tactile cues   Comprehension Verbalized understanding;Returned demonstration  PT Long Term Goals - 09/08/16 1725      PT LONG TERM GOAL #1   Title Patient will improve shoulder ROM to > 140 degrees of flexion, scaption, and abduction for improved ability to perform overhead activities.   Baseline 7/9: pt limited to 90 flexion and abduction per protocol at this time   Time 6   Period Weeks   Status New     PT LONG TERM GOAL #2   Title Patient will decrease  Quick DASH score by > 8 (to 5%) points demonstrating reduced self-reported upper extremity disability.   Baseline 7/9: 13.63   Time 6   Period Weeks   Status New     PT LONG TERM GOAL #3   Title Patient will demonstrate LUE strength of 4+/5 to return to competative collegiate basketball    Baseline 7/9: will assess in 3 weeks   Time 6   Period Weeks   Status New     PT LONG TERM GOAL #4   Title Pt. LUE will be equal to RUE strength within 5 lbs allowing pt. to return to weight lifting regime.    Baseline 7/9: unable to lift at this time   Time 6   Period Weeks   Status New               Plan - 09/30/16 1747    Clinical Impression Statement Patient continues to progress with functional strength and ROM. Gym program will be introduced next session. Minimal cueing required for body mechanics with patient demonstrating good sequencing and no compensations for weak musculature. Patient will continue to benefit from skilled physical therapy to improve shoulder mobility and strength for return to previous level of function.    Rehab Potential Excellent   Clinical Impairments Affecting Rehab Potential  (-) s/p surgery, adherance to wearing sling, (+) desire to return to sport, age   PT Frequency 2x / week   PT Duration 6 weeks   PT Treatment/Interventions ADLs/Self Care Home Management;Biofeedback;Cryotherapy;Electrical Stimulation;Iontophoresis 4mg /ml Dexamethasone;Moist Heat;Ultrasound;Therapeutic activities;Functional mobility training;Therapeutic exercise;Neuromuscular re-education;Patient/family education;Manual techniques;Compression bandaging;Scar mobilization;Passive range of motion;Dry needling;Energy conservation;Splinting;Taping;Visual/perceptual remediation/compensation   PT Next Visit Plan gym program   PT Home Exercise Plan see sheet   Consulted and Agree with Plan of Care Patient      Patient will benefit from skilled therapeutic intervention in order to improve the  following deficits and impairments:  Decreased knowledge of precautions, Decreased mobility, Decreased range of motion, Decreased safety awareness, Decreased strength, Decreased scar mobility, Hypomobility, Impaired UE functional use, Improper body mechanics, Postural dysfunction  Visit Diagnosis: Stiffness of left shoulder, not elsewhere classified  Muscle weakness (generalized)  Abnormal posture     Problem List Patient Active Problem List   Diagnosis Date Noted  . History of arthroscopy of left shoulder 09/08/2016   Precious BardMarina Britain Saber, PT, DPT   Precious BardMarina Kento Gossman 09/30/2016, 5:48 PM  [facility] [address] , [place], [address] Phone: [phone]   Fax:  [phone]  Name: William Humphrey A Tusing MRN: [id number] Date of Birth: 08/25/1995

## 2016-10-02 ENCOUNTER — Ambulatory Visit: Payer: 59 | Attending: Specialist

## 2016-10-02 DIAGNOSIS — M6281 Muscle weakness (generalized): Secondary | ICD-10-CM | POA: Insufficient documentation

## 2016-10-02 DIAGNOSIS — M25612 Stiffness of left shoulder, not elsewhere classified: Secondary | ICD-10-CM | POA: Insufficient documentation

## 2016-10-02 DIAGNOSIS — R293 Abnormal posture: Secondary | ICD-10-CM | POA: Insufficient documentation

## 2016-10-02 NOTE — Therapy (Signed)
[facility] [address] [place], [place], [address] Phone: [phone]   Fax:  [phone]  Physical Therapy Treatment  Patient Details  Name: William Humphrey MRN: [id number] Date of Birth: 08/13/1995 Referring Provider: Jene EveryBeane, Jeffrey, MD  Encounter Date: 10/02/2016      PT End of Session - 10/03/16 0759    Visit Number 8   Number of Visits 12   Date for PT Re-Evaluation 10/20/16   PT Start Time 1515   PT Stop Time 1600   PT Time Calculation (min) 45 min   Activity Tolerance Patient tolerated treatment well   Behavior During Therapy [facility] for tasks assessed/performed      History reviewed. No pertinent past medical history.  Past Surgical History:  Procedure Laterality Date  . SHOULDER ARTHROSCOPY WITH LABRAL REPAIR Left 08/05/2016   Procedure: LEFT SHOULDER ARTHROSCOPY WITH LABRAL REPAIR;  Surgeon: Cammy Copaean, Scott Gregory, MD;  [facility];  Service: Orthopedics;  Laterality: Left;  . TOOTH EXTRACTION  2008    There were no vitals filed for this visit.      Subjective Assessment - 10/03/16 0756    Subjective Patient reports feeling some muscular soreness that resolved quickly. No pain or laxity of joint reported   Pertinent History Pt. 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 Prior to surgery pt. sustained anterior dislocation a few years ago and had symptomatic instability since. Pt. had tera of anterior inferior labrum with tear of posterior labrum extending from the 1 oclock position to the 5 oclock position in the left glenoid with intact rotator cuff. Orders request PROM below shoulder level with ER <30, Abd <90, FF <90 and isometrics of RC and deltoid for three weeks. Pt. Is currently only doing 30 clockwise, and 30 counterclockwise pendulums. 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    Limitations Lifting;Reading;Walking;Writing;House hold activities   How long can you sit comfortably? n/a   How long can you stand comfortably? n/a   How long can you walk comfortably? 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 No/denies         Manual:.  PROMwith10x20 second holdeach direction: allowed overhead motion now. rhythmic perturbation's 2x60 seconds   TherEx Seated Wall walksflexion 8x abd 6x Scapular punches 7lb dumbbell 2x15 sidelying external rotation2 lb dumbbell 2x12 Prone shoulder extension and horizontal abductionover swiss ball x10 each Bent over rows Matrix 7.5 each side overhand, underhand Arm raises 21. (7x7x7)x2 for deltoids  Matrix machine: Straight arm lat pull down 7.5lb  10x 12.5 10x  tricep pulldown 7.5lb 2x10 Bicep curl 7.5 lb 2x15 T Band IR, ER, 7.5 on machine, 2x15, cues for decreased velocity, ER harder than IR,   PROM : flexion 144, 148  Pt. response to medical necessity: Patient will continue to benefit from skilled physical therapy to improve shoulder mobility and strength for return to previous level of function         PT Long Term Goals - 09/08/16 1725      PT LONG TERM GOAL #1   Title Patient will improve shoulder ROM to > 140 degrees of flexion, scaption, and abduction for improved ability to perform overhead activities.   Baseline  7/9: pt limited to 90 flexion and abduction per protocol at this time   Time 6   Period Weeks   Status New     PT LONG TERM GOAL #2   Title Patient will decrease Quick DASH score by > 8 (to 5%) points demonstrating reduced self-reported upper extremity disability.   Baseline 7/9: 13.63   Time 6   Period Weeks   Status New     PT LONG TERM GOAL #3   Title Patient will demonstrate LUE strength of  4+/5 to return to competative collegiate basketball    Baseline 7/9: will assess in 3 weeks   Time 6   Period Weeks   Status New     PT LONG TERM GOAL #4   Title Pt. LUE will be equal to RUE strength within 5 lbs allowing pt. to return to weight lifting regime.    Baseline 7/9: unable to lift at this time   Time 6   Period Weeks   Status New               Plan - 10/03/16 0800    Clinical Impression Statement Patient progressing with functional strength while demonstrating safe body mechanics and understanding of restrictions of weight at this time due to the timing of his surgery. Patient continues to require cueing for upright posture, preferring to slump with forward shoulders which could lead to negative postural healing. Patient will continue to benefit from skilled physical therapy to improve shoulder mobility and strength for return to previous level of function   Rehab Potential Excellent   Clinical Impairments Affecting Rehab Potential  (-) s/p surgery, adherance to wearing sling, (+) desire to return to sport, age   PT Frequency 2x / week   PT Duration 6 weeks   PT Treatment/Interventions ADLs/Self Care Home Management;Biofeedback;Cryotherapy;Electrical Stimulation;Iontophoresis 4mg /ml Dexamethasone;Moist Heat;Ultrasound;Therapeutic activities;Functional mobility training;Therapeutic exercise;Neuromuscular re-education;Patient/family education;Manual techniques;Compression bandaging;Scar mobilization;Passive range of motion;Dry needling;Energy conservation;Splinting;Taping;Visual/perceptual remediation/compensation   PT Next Visit Plan gym program   PT Home Exercise Plan see sheet   Consulted and Agree with Plan of Care Patient      Patient will benefit from skilled therapeutic intervention in order to improve the following deficits and impairments:  Decreased knowledge of precautions, Decreased mobility, Decreased range of motion, Decreased safety awareness, Decreased  strength, Decreased scar mobility, Hypomobility, Impaired UE functional use, Improper body mechanics, Postural dysfunction  Visit Diagnosis: Stiffness of left shoulder, not elsewhere classified  Muscle weakness (generalized)  Abnormal posture     Problem List Patient Active Problem List   Diagnosis Date Noted  . History of arthroscopy of left shoulder 09/08/2016   Precious BardMarina Lorana Maffeo, PT, DPT   Precious BardMarina Zanai Mallari 10/03/2016, 8:01 AM  [facility] [address] [place], [place], [address] Phone: [phone]   Fax:  [phone]  Name: William Humphrey MRN: [id number] Date of Birth: 02/23/1996

## 2016-10-07 ENCOUNTER — Ambulatory Visit: Payer: 59

## 2016-10-07 DIAGNOSIS — M25612 Stiffness of left shoulder, not elsewhere classified: Secondary | ICD-10-CM

## 2016-10-07 DIAGNOSIS — M6281 Muscle weakness (generalized): Secondary | ICD-10-CM | POA: Diagnosis not present

## 2016-10-07 DIAGNOSIS — R293 Abnormal posture: Secondary | ICD-10-CM

## 2016-10-07 NOTE — Therapy (Signed)
[place] MAIN [facility] SERVICES [address] [place], [place], [address] Phone: [phone]   Fax:  [phone]  Physical Therapy Treatment  Patient Details  Name: William Humphrey MRN: [id number] Date of Birth: 05-Sep-1995 Referring Provider: [name], MD  Encounter Date: 10/07/2016      PT End of Session - 10/08/16 0701    Visit Number 9   Number of Visits 22   Date for PT Re-Evaluation 11/11/16   PT Start Time 3299   PT Stop Time 1645   PT Time Calculation (min) 38 min   Activity Tolerance Patient tolerated treatment well   Behavior During Therapy [facility] for tasks assessed/performed      History reviewed. No pertinent past medical history.  Past Surgical History:  Procedure Laterality Date  . SHOULDER ARTHROSCOPY WITH LABRAL REPAIR Left 08/05/2016   Procedure: LEFT SHOULDER ARTHROSCOPY WITH LABRAL REPAIR;  Surgeon: Meredith Pel, MD;  Location: [place];  Service: Orthopedics;  Laterality: Left;  . TOOTH EXTRACTION  2008    There were no vitals filed for this visit.      Subjective Assessment - 10/08/16 0700    Subjective Patient went to gym one time since last visit and reports compliance with low weights and low stress.    Pertinent History Pt. 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 Prior to surgery pt. sustained anterior dislocation a few years ago and had symptomatic instability since. Pt. had tera of anterior inferior labrum with tear of posterior labrum extending from the 1 oclock position to the 5 oclock position in the left glenoid with intact rotator cuff. Orders request PROM below shoulder level with ER <30, Abd <90, FF <90 and isometrics of RC and deltoid for three weeks. Pt. Is currently only doing 30 clockwise, and 30 counterclockwise pendulums. 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    Limitations Lifting;Reading;Walking;Writing;House hold activities   How long can you sit comfortably? n/a   How long can you stand comfortably? n/a   How long can you walk comfortably? 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 No/denies       Right Left  Upper trap 5/5 4-/5  Biceps 5/5 4-/5  Triceps 5/5 4-/5  abduction 5/5 4/5  ER 5/5 4-/5  IR 5/5 4-/5  extension 5/5 4/5      ROM >140 degrees flexion, scaption, and abduction Seated AROM   Abduction: 125  Flexion: 131  ER 68  IR WFL Supine AROM  Abduction: 149  Flexion: 151  ER: 69  IR: WFL QuickDash Sport=100%, QuickDash form=0%.      TherEx Seated basketball shot x 10  10 seated basketball passes, cues for pushing with L shoulder 10 seated basketball catches  10 seated bounce passes  Wall circles with basketball 1 min clockwise, 1 min counterclockwise with pendulums inbetween Side stepping bouncing ball against wall 8x Side stepping squat rolling ball sideways and back 3x each direction x 10 Rolling baskeball up and down wall with squat x 12 Seated Wall walks flexion 8x abd 6x  Matrix machine:  IR, ER, 12.5 on machine, 2x15, cues for decreased velocity, ER harder than IR,     Pt. response to medical  necessity: Patient will continue to benefit from skilled physical therapy to improve shoulder mobility and strength for return to previous level of function                            PT Education - 10/08/16 0701    Education provided Yes   Education Details basketball shots, passes, bounce passes restrictions and allowances   Person(s) Educated Patient   Methods Explanation   Comprehension Verbalized understanding             PT Long Term Goals - 10/08/16 0704      PT LONG TERM GOAL #1   Title  Patient will improve shoulder ROM to > 140 degrees of flexion, scaption, and abduction for improved ability to perform overhead activities.   Baseline 8/8:  Flexion: arom=125, abduction arom=131; 7/9pt limited to 90 flexion and abduction per protocol at this time   Time 6   Period Weeks   Status Partially Met   Target Date 11/05/16     PT LONG TERM GOAL #2   Title Patient will decrease Quick DASH score by > 8 (to 5%) points demonstrating reduced self-reported upper extremity disability.   Baseline 8/8: 0%; 7/9: 13.63   Time 6   Period Weeks   Status Achieved     PT LONG TERM GOAL #3   Title Patient will demonstrate LUE strength of 4+/5 to return to competative collegiate basketball    Baseline 8/8: L grossly 4-/5, R 5/5 7/9: will assess in 3 weeks   Time 6   Period Weeks   Status Partially Met     PT LONG TERM GOAL #4   Title Pt. LUE will be equal to RUE strength within 5 lbs allowing pt. to return to weight lifting regime.    Baseline 8/7: LUE weaker than RUE; 7/9: unable to lift at this time   Time 6   Period Weeks   Status On-going     PT LONG TERM GOAL #5   Title Patient will decrease QuickDash sports module to <50% to allow for pt. to return to previous level of function   Baseline 8/8: 100%   Time 6   Period Weeks   Status New   Target Date 11/11/16               Plan - 10/08/16 0703    Clinical Impression Statement Patient continues to progress with functional ROM and strength but is fatigued quickly when musculoskeletal demands require higher sets. External rotation continues to challenge pt. As well as prolonged flexion, both of which are required for pt. Patient continues to require cueing for upright posture, preferring to slump with forward shoulders, which could potentially lead to negative postural healing. To return to sport. QuickDash sport=100% whereas QuickDash=0%. ROM: Flexion : 140 (A), 151 (P), Abduction 125 (A), 149 (P). MMT L grossly 4-/5, R  5/5.Patient will continue to benefit from skilled physical therapy to improve shoulder mobility and strength for return to previous level of function   Rehab Potential Excellent   Clinical Impairments Affecting Rehab Potential  (-) s/p surgery, adherance to wearing sling, (+) desire to return to sport, age   PT Frequency 2x / week   PT Duration 6 weeks   PT Treatment/Interventions ADLs/Self Care Home Management;Biofeedback;Cryotherapy;Electrical Stimulation;Iontophoresis 56m/ml Dexamethasone;Moist Heat;Ultrasound;Therapeutic activities;Functional mobility training;Therapeutic exercise;Neuromuscular re-education;Patient/family education;Manual techniques;Compression bandaging;Scar mobilization;Passive range of motion;Dry needling;Energy conservation;Splinting;Taping;Visual/perceptual remediation/compensation   PT Next Visit Plan basketball  passing, shooting   PT Home Exercise Plan see sheet   Consulted and Agree with Plan of Care Patient      Patient will benefit from skilled therapeutic intervention in order to improve the following deficits and impairments:  Decreased knowledge of precautions, Decreased mobility, Decreased range of motion, Decreased safety awareness, Decreased strength, Decreased scar mobility, Hypomobility, Impaired UE functional use, Improper body mechanics, Postural dysfunction  Visit Diagnosis: Stiffness of left shoulder, not elsewhere classified  Muscle weakness (generalized)  Abnormal posture     Problem List Patient Active Problem List   Diagnosis Date Noted  . History of arthroscopy of left shoulder 09/08/2016   Janna Arch, PT, DPT   Janna Arch 10/08/2016, 7:08 AM  [place] MAIN [facility] SERVICES [address] [place], [place], [address] Phone: [phone]   Fax:  [phone]  Name: TEYTON PATTILLO MRN: [id number] Date of Birth: [birth year]

## 2016-10-09 ENCOUNTER — Ambulatory Visit: Payer: 59

## 2016-10-09 DIAGNOSIS — M6281 Muscle weakness (generalized): Secondary | ICD-10-CM

## 2016-10-09 DIAGNOSIS — M25612 Stiffness of left shoulder, not elsewhere classified: Secondary | ICD-10-CM

## 2016-10-09 DIAGNOSIS — R293 Abnormal posture: Secondary | ICD-10-CM | POA: Diagnosis not present

## 2016-10-09 NOTE — Therapy (Signed)
[place] MAIN [facility] SERVICES [address] [place], [place], [address] Phone: [phone]   Fax:  [phone]  Physical Therapy Treatment  Patient Details  Name: William Humphrey MRN: [id number] Date of Birth: 1996-01-24 Referring Provider: [name], MD  Encounter Date: 10/09/2016      PT End of Session - 10/09/16 1749    Visit Number 10   Number of Visits 22   Date for PT Re-Evaluation 11/11/16   PT Start Time 1600   PT Stop Time 1645   PT Time Calculation (min) 45 min   Activity Tolerance Patient tolerated treatment well   Behavior During Therapy [facility] for tasks assessed/performed      History reviewed. No pertinent past medical history.  Past Surgical History:  Procedure Laterality Date  . SHOULDER ARTHROSCOPY WITH LABRAL REPAIR Left 08/05/2016   Procedure: LEFT SHOULDER ARTHROSCOPY WITH LABRAL REPAIR;  Surgeon: Meredith Pel, MD;  Location: [place];  Service: Orthopedics;  Laterality: Left;  . TOOTH EXTRACTION  2008    There were no vitals filed for this visit. Manual:.  PROM with 10x20 second hold each direction: allowed overhead motion now. rhythmic perturbation's 2x60 seconds  TherEx  Wall circles with basketball 1 min clockwise, 1 min counterclockwise with pendulums inbetween Side stepping bouncing ball against wall 20x complete flexion  Side stepping squat rolling ball sideways and back 3x each direction x 10 Four square side shuffle with ball extended to pass at each corner x 8 Quadruped to prayer stretch x 10 Modified plank (quadruped) with arm walk outs x10 Quadruped with passing basketball back and forth Laying supine on mat: bench press 10lb 2x15, close grip bench press 5lb 2x12 Abduction 21s holding basketball on alt hand with alt hand raised to side  Standing Wall walks abd 7x     Pt. response to medical necessity: Patient will continue to benefit from skilled physical therapy to improve shoulder  mobility and strength for return to previous level of function       Subjective Assessment - 10/09/16 1748    Subjective Patient is compliant with HEP and went to a birthday even the day prior. He continues to have no pain.    Pertinent History Pt. 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 Prior to surgery pt. sustained anterior dislocation a few years ago and had symptomatic instability since. Pt. had tera of anterior inferior labrum with tear of posterior labrum extending from the 1 oclock position to the 5 oclock position in the left glenoid with intact rotator cuff. Orders request PROM below shoulder level with ER <30, Abd <90, FF <90 and isometrics of RC and deltoid for three weeks. Pt. Is currently only doing 30 clockwise, and 30 counterclockwise pendulums. 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    Limitations Lifting;Reading;Walking;Writing;House hold activities   How long can you sit comfortably? n/a   How long can you stand comfortably? n/a   How long can you walk comfortably? 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 No/denies          PT Education - 10/09/16 1749    Education provided Yes   Education Details prolonged overhead reach/hold    [employer]) Educated  Patient   Methods Explanation;Demonstration   Comprehension Verbalized understanding;Returned demonstration             PT Long Term Goals - 10/08/16 0704      PT LONG TERM GOAL #1   Title Patient will improve shoulder ROM to > 140 degrees of flexion, scaption, and abduction for improved ability to perform overhead activities.   Baseline 8/8:  Flexion: arom=125, abduction arom=131; 7/9pt limited to 90  flexion and abduction per protocol at this time   Time 6   Period Weeks   Status Partially Met   Target Date 11/05/16     PT LONG TERM GOAL #2   Title Patient will decrease Quick DASH score by > 8 (to 5%) points demonstrating reduced self-reported upper extremity disability.   Baseline 8/8: 0%; 7/9: 13.63   Time 6   Period Weeks   Status Achieved     PT LONG TERM GOAL #3   Title Patient will demonstrate LUE strength of 4+/5 to return to competative collegiate basketball    Baseline 8/8: L grossly 4-/5, R 5/5 7/9: will assess in 3 weeks   Time 6   Period Weeks   Status Partially Met     PT LONG TERM GOAL #4   Title Pt. LUE will be equal to RUE strength within 5 lbs allowing pt. to return to weight lifting regime.    Baseline 8/7: LUE weaker than RUE; 7/9: unable to lift at this time   Time 6   Period Weeks   Status On-going     PT LONG TERM GOAL #5   Title Patient will decrease QuickDash sports module to <50% to allow for pt. to return to previous level of function   Baseline 8/8: 100%   Time 6   Period Weeks   Status New   Target Date 11/11/16               Plan - 10/09/16 1750    Clinical Impression Statement Patient continues to progress with functional strength and ROM. Maintain overhead position is challenging to patient and fatigues quickly. Practicing prolonged reach improved pt.'s capacity for holding arm. Quadruped activities implemented to improve stability. Patient will continue to benefit from skilled physical therapy to improve shoulder mobility and strength for return to previous level of function   Rehab Potential Excellent   Clinical Impairments Affecting Rehab Potential  (-) s/p surgery, adherance to wearing sling, (+) desire to return to sport, age   PT Frequency 2x / week   PT Duration 6 weeks   PT Treatment/Interventions ADLs/Self Care Home Management;Biofeedback;Cryotherapy;Electrical Stimulation;Iontophoresis 52m/ml Dexamethasone;Moist  Heat;Ultrasound;Therapeutic activities;Functional mobility training;Therapeutic exercise;Neuromuscular re-education;Patient/family education;Manual techniques;Compression bandaging;Scar mobilization;Passive range of motion;Dry needling;Energy conservation;Splinting;Taping;Visual/perceptual remediation/compensation   PT Next Visit Plan basketball passing, shooting   PT Home Exercise Plan see sheet   Consulted and Agree with Plan of Care Patient      Patient will benefit from skilled therapeutic intervention in order to improve the following deficits and impairments:  Decreased knowledge of precautions, Decreased mobility, Decreased range of motion, Decreased safety awareness, Decreased strength, Decreased scar mobility, Hypomobility, Impaired UE functional use, Improper body mechanics, Postural dysfunction  Visit Diagnosis: Stiffness of left shoulder, not elsewhere classified  Muscle weakness (generalized)  Abnormal posture     Problem List Patient Active Problem List   Diagnosis Date Noted  . History of arthroscopy of left shoulder 09/08/2016   MJanna Arch PT, DPT   MJanna Arch8/11/2016, 5:51 PM  [facility]  [place] MAIN [facility] SERVICES [address] [place], [place], [address] Phone: [phone]   Fax:  [phone]  Name: SESAR MADEWELL MRN: [id number] Date of Birth: 09-Mar-1995

## 2016-10-13 ENCOUNTER — Ambulatory Visit: Payer: 59

## 2016-10-13 DIAGNOSIS — R293 Abnormal posture: Secondary | ICD-10-CM

## 2016-10-13 DIAGNOSIS — M25612 Stiffness of left shoulder, not elsewhere classified: Secondary | ICD-10-CM | POA: Diagnosis not present

## 2016-10-13 DIAGNOSIS — M6281 Muscle weakness (generalized): Secondary | ICD-10-CM

## 2016-10-13 NOTE — Therapy (Signed)
[place] MAIN [facility] SERVICES [address] [place], [place], [address] Phone: [phone]   Fax:  [phone]  Physical Therapy Treatment  Patient Details  Name: William Humphrey MRN: [id number] Date of Birth: Oct 31, 1995 Referring Provider: [name], MD  Encounter Date: 10/13/2016      PT End of Session - 10/13/16 1631    Visit Number 11   Number of Visits 22   Date for PT Re-Evaluation 11/11/16   PT Start Time 1600   PT Stop Time 1645   PT Time Calculation (min) 45 min   Activity Tolerance Patient tolerated treatment well   Behavior During Therapy [facility] for tasks assessed/performed      History reviewed. No pertinent past medical history.  Past Surgical History:  Procedure Laterality Date  . SHOULDER ARTHROSCOPY WITH LABRAL REPAIR Left 08/05/2016   Procedure: LEFT SHOULDER ARTHROSCOPY WITH LABRAL REPAIR;  Surgeon: Meredith Pel, MD;  Location: [place];  Service: Orthopedics;  Laterality: Left;  . TOOTH EXTRACTION  2008    There were no vitals filed for this visit.      Subjective Assessment - 10/13/16 1356    Subjective Patient went to they gym over the weekend and performed HEP with no soreness or pain. continues to be compliant with HEP.    Pertinent History Pt. 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 Prior to surgery pt. sustained anterior dislocation a few years ago and had symptomatic instability since. Pt. had tera of anterior inferior labrum with tear of posterior labrum extending from the 1 oclock position to the 5 oclock position in the left glenoid with intact rotator cuff. Orders request PROM below shoulder level with ER <30, Abd <90, FF <90 and isometrics of RC and deltoid for three weeks. Pt. Is currently only doing 30 clockwise, and 30 counterclockwise pendulums. 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    Limitations Lifting;Reading;Walking;Writing;House hold activities   How long can you sit comfortably? n/a   How long can you stand comfortably? n/a   How long can you walk comfortably? 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 No/denies      Manual:.  PROM with 10x20 second hold each direction: allowed overhead motion now. rhythmic perturbation's 2x60 seconds subscap stm  TherEx  Wall circles with basketball 1 min clockwise, 1 min counterclockwise with pendulums inbetween Side stepping bouncing ball against wall 20x complete flexion  Side stepping squat rolling ball sideways and back 3x each direction x 10 Four square side shuffle with dribble with ball extended to pass at each corner x 4 Quadruped to prayer stretch x 10 with cues to stretch out further at prayer stretch Modified plank (quadruped) with arm walk outs x10 Quadruped forward arm reach x 10, abduction arm reach x 10 Dribbling ball 6x15 ft Passing ball 2x6 minutes with cues for acceptance of ball to reduce stress on shoulder Passing ball with side steps in between to enhance reaction time with reduction of stress on shoulder Toe raise arm flexion wall taps 15x Flexion overhead circles 10x clockwise, 10x counterclockwise.       Pt. response to medical necessity: Patient will continue to benefit from skilled physical therapy to improve shoulder  mobility and strength for return to previous level of function        PT Education - 10/13/16 1356    Education provided Yes   Education Details body mechanics when passing and catching ball to reduce loads   Person(s) Educated Patient   Methods Explanation;Demonstration   Comprehension Returned demonstration;Verbalized understanding             PT Long Term Goals -  10/08/16 0704      PT LONG TERM GOAL #1   Title Patient will improve shoulder ROM to > 140 degrees of flexion, scaption, and abduction for improved ability to perform overhead activities.   Baseline 8/8:  Flexion: arom=125, abduction arom=131; 7/9pt limited to 90 flexion and abduction per protocol at this time   Time 6   Period Weeks   Status Partially Met   Target Date 11/05/16     PT LONG TERM GOAL #2   Title Patient will decrease Quick DASH score by > 8 (to 5%) points demonstrating reduced self-reported upper extremity disability.   Baseline 8/8: 0%; 7/9: 13.63   Time 6   Period Weeks   Status Achieved     PT LONG TERM GOAL #3   Title Patient will demonstrate LUE strength of 4+/5 to return to competative collegiate basketball    Baseline 8/8: L grossly 4-/5, R 5/5 7/9: will assess in 3 weeks   Time 6   Period Weeks   Status Partially Met     PT LONG TERM GOAL #4   Title Pt. LUE will be equal to RUE strength within 5 lbs allowing pt. to return to weight lifting regime.    Baseline 8/7: LUE weaker than RUE; 7/9: unable to lift at this time   Time 6   Period Weeks   Status On-going     PT LONG TERM GOAL #5   Title Patient will decrease QuickDash sports module to <50% to allow for pt. to return to previous level of function   Baseline 8/8: 100%   Time 6   Period Weeks   Status New   Target Date 11/11/16               Plan - 10/13/16 1633    Clinical Impression Statement Patient continues to progress with sport specific tasks. Overhead motion continuous to challenge patient leading to fatigue much quicker than 90 degree range. Stability progressing with decreased trembling of musculature upon quadruped position. Patient will continue to benefit from skilled physical therapy to improve shoulder mobility and strength for return to previous level of function   Rehab Potential Excellent   Clinical Impairments Affecting Rehab Potential  (-) s/p surgery, adherance to  wearing sling, (+) desire to return to sport, age   PT Frequency 2x / week   PT Duration 6 weeks   PT Treatment/Interventions ADLs/Self Care Home Management;Biofeedback;Cryotherapy;Electrical Stimulation;Iontophoresis 84m/ml Dexamethasone;Moist Heat;Ultrasound;Therapeutic activities;Functional mobility training;Therapeutic exercise;Neuromuscular re-education;Patient/family education;Manual techniques;Compression bandaging;Scar mobilization;Passive range of motion;Dry needling;Energy conservation;Splinting;Taping;Visual/perceptual remediation/compensation   PT Next Visit Plan basketball passing, shooting   PT Home Exercise Plan see sheet   Consulted and Agree with Plan of Care Patient      Patient will benefit from skilled therapeutic intervention in order to improve the following deficits and impairments:  Decreased knowledge of precautions, Decreased mobility, Decreased range of motion, Decreased safety awareness, Decreased strength, Decreased scar mobility, Hypomobility, Impaired UE functional use, Improper body mechanics, Postural dysfunction  Visit Diagnosis: Stiffness of left shoulder, not elsewhere classified  Muscle weakness (generalized)  Abnormal posture     Problem List Patient Active Problem List   Diagnosis Date Noted  . History of arthroscopy of left shoulder 09/08/2016   Janna Arch, PT, DPT   Janna Arch 10/13/2016, 4:34 PM  [place] MAIN [facility] SERVICES [address] [place], [place], [address] Phone: [phone]   Fax:  [phone]  Name: JOHNEDWARD BRODRICK MRN: [id number] Date of Birth: 04/03/1995

## 2016-10-16 ENCOUNTER — Ambulatory Visit: Payer: 59

## 2016-10-16 DIAGNOSIS — R293 Abnormal posture: Secondary | ICD-10-CM

## 2016-10-16 DIAGNOSIS — M6281 Muscle weakness (generalized): Secondary | ICD-10-CM | POA: Diagnosis not present

## 2016-10-16 DIAGNOSIS — M25612 Stiffness of left shoulder, not elsewhere classified: Secondary | ICD-10-CM

## 2016-10-16 NOTE — Therapy (Signed)
[place] MAIN [facility] SERVICES [address] [place], [place], [address] Phone: [phone]   Fax:  [phone]  Physical Therapy Treatment  Patient Details  Name: William Humphrey MRN: [id number] Date of Birth: 10-May-1995 Referring Provider: [name], MD  Encounter Date: 10/16/2016      PT End of Session - 10/16/16 1746    Visit Number 12   Number of Visits 22   Date for PT Re-Evaluation 11/11/16   PT Start Time 1600   PT Stop Time 1645   PT Time Calculation (min) 45 min   Activity Tolerance Patient tolerated treatment well   Behavior During Therapy [facility] for tasks assessed/performed      History reviewed. No pertinent past medical history.  Past Surgical History:  Procedure Laterality Date  . SHOULDER ARTHROSCOPY WITH LABRAL REPAIR Left 08/05/2016   Procedure: LEFT SHOULDER ARTHROSCOPY WITH LABRAL REPAIR;  Surgeon: Meredith Pel, MD;  Location: [place];  Service: Orthopedics;  Laterality: Left;  . TOOTH EXTRACTION  2008    There were no vitals filed for this visit.      Subjective Assessment - 10/16/16 1744    Subjective Patient went to the gym yesterday and feels slightly sore but without pain.    Pertinent History Pt. 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 Prior to surgery pt. sustained anterior dislocation a few years ago and had symptomatic instability since. Pt. had tera of anterior inferior labrum with tear of posterior labrum extending from the 1 oclock position to the 5 oclock position in the left glenoid with intact rotator cuff. Orders request PROM below shoulder level with ER <30, Abd <90, FF <90 and isometrics of RC and deltoid for three weeks. Pt. Is currently only doing 30 clockwise, and 30 counterclockwise pendulums. 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    Limitations Lifting;Reading;Walking;Writing;House hold activities   How long can you sit comfortably? n/a   How long can you stand comfortably? n/a   How long can you walk comfortably? 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 No/denies      Manual:.  PROM with 10x20 second hold each direction: allowed overhead motion now. rhythmic perturbation's 2x60 seconds subscap stm   TherEx  Body blade abduction to 90 x 6 Body blade abduction 90 to overhead x6 Body blade flexion x 8 Body blade cross body D1 and d2 pattern 8x each arm cues to rest between pertubations holding GTB with arm in scapular punch position x 2 minutes Wall circles with basketball 1 min clockwise, 1 min counterclockwise with pendulums inbetween Bear crawl 8x20 ft Four square side shuffle with dribble with ball extended to pass at each corner x 4 Dribble 2x20 Half bosu ball blue side up partial tricep pushup with knees on ground bosu ball black side up quadruped x 60 seconds bosu ball black side quadruped forwards and backwards  Quadruped to prayer stretch x 10 with cues to stretch out further at prayer stretch Modified plank (quadruped) with arm walk outs x10 Quadruped forward arm reach x 10, abduction arm reach x 10 Dribbling ball 6x15 ft Passing ball 2x6 minutes with cues for acceptance of ball to reduce stress on shoulder Passing ball  with side steps in between to enhance reaction time with reduction of stress on shoulder Toe raise arm flexion wall taps 15x Flexion overhead circles 10x clockwise, 10x counterclockwise.       Pt. response to medical necessity: Patient will continue to benefit from skilled physical therapy to improve shoulder mobility and strength for return to previous level of function                             PT  Education - 10/16/16 1746    Education provided Yes   Education Details quadruped progressions, dribbling, next step for returning to school   Person(s) Educated Patient   Methods Explanation;Demonstration;Verbal cues   Comprehension Verbalized understanding;Returned demonstration             PT Long Term Goals - 10/08/16 0704      PT LONG TERM GOAL #1   Title Patient will improve shoulder ROM to > 140 degrees of flexion, scaption, and abduction for improved ability to perform overhead activities.   Baseline 8/8:  Flexion: arom=125, abduction arom=131; 7/9pt limited to 90 flexion and abduction per protocol at this time   Time 6   Period Weeks   Status Partially Met   Target Date 11/05/16     PT LONG TERM GOAL #2   Title Patient will decrease Quick DASH score by > 8 (to 5%) points demonstrating reduced self-reported upper extremity disability.   Baseline 8/8: 0%; 7/9: 13.63   Time 6   Period Weeks   Status Achieved     PT LONG TERM GOAL #3   Title Patient will demonstrate LUE strength of 4+/5 to return to competative collegiate basketball    Baseline 8/8: L grossly 4-/5, R 5/5 7/9: will assess in 3 weeks   Time 6   Period Weeks   Status Partially Met     PT LONG TERM GOAL #4   Title Pt. LUE will be equal to RUE strength within 5 lbs allowing pt. to return to weight lifting regime.    Baseline 8/7: LUE weaker than RUE; 7/9: unable to lift at this time   Time 6   Period Weeks   Status On-going     PT LONG TERM GOAL #5   Title Patient will decrease QuickDash sports module to <50% to allow for pt. to return to previous level of function   Baseline 8/8: 100%   Time 6   Period Weeks   Status New   Target Date 11/11/16               Plan - 10/16/16 1747    Clinical Impression Statement Patient progressing with functional strengthening of post surgical shoulder with dynamic motion. Body blade was challenging to patient due to the pertubation's and response  required upon shoulder. Stability interventions in quadruped were progressed to being performed on Bosu ball.  Patient will continue to benefit from skilled physical therapy to improve shoulder mobility and strength for return to previous level of function   Rehab Potential Excellent   Clinical Impairments Affecting Rehab Potential  (-) s/p surgery, adherance to wearing sling, (+) desire to return to sport, age   PT Frequency 2x / week   PT Duration 6 weeks   PT Treatment/Interventions ADLs/Self Care Home Management;Biofeedback;Cryotherapy;Electrical Stimulation;Iontophoresis 37m/ml Dexamethasone;Moist Heat;Ultrasound;Therapeutic activities;Functional mobility training;Therapeutic exercise;Neuromuscular re-education;Patient/family education;Manual techniques;Compression bandaging;Scar mobilization;Passive range of motion;Dry needling;Energy conservation;Splinting;Taping;Visual/perceptual remediation/compensation   PT Next Visit Plan quadruped, plank,  check weights for weight lifting   PT Home Exercise Plan see sheet   Consulted and Agree with Plan of Care Patient      Patient will benefit from skilled therapeutic intervention in order to improve the following deficits and impairments:  Decreased knowledge of precautions, Decreased mobility, Decreased range of motion, Decreased safety awareness, Decreased strength, Decreased scar mobility, Hypomobility, Impaired UE functional use, Improper body mechanics, Postural dysfunction  Visit Diagnosis: Stiffness of left shoulder, not elsewhere classified  Muscle weakness (generalized)  Abnormal posture     Problem List Patient Active Problem List   Diagnosis Date Noted  . History of arthroscopy of left shoulder 09/08/2016   Janna Arch, PT, DPT   Janna Arch 10/16/2016, 5:48 PM  [place] MAIN [facility] SERVICES [address] [place], [place], [address] Phone: [phone]   Fax:  [phone]  Name:  LAVERT MATOUSEK MRN: [id number] Date of Birth: 18-Aug-1995

## 2016-10-21 ENCOUNTER — Ambulatory Visit: Payer: 59

## 2016-10-21 DIAGNOSIS — M6281 Muscle weakness (generalized): Secondary | ICD-10-CM

## 2016-10-21 DIAGNOSIS — M25612 Stiffness of left shoulder, not elsewhere classified: Secondary | ICD-10-CM

## 2016-10-21 DIAGNOSIS — R293 Abnormal posture: Secondary | ICD-10-CM

## 2016-10-21 NOTE — Therapy (Signed)
[place] MAIN [facility] SERVICES [address] [place], [place], [address] Phone: [phone]   Fax:  [phone]  Physical Therapy Treatment  Patient Details  Name: William Humphrey MRN: [id number] Date of Birth: 1996/01/30 Referring Provider: [name], MD  Encounter Date: 10/21/2016      PT End of Session - 10/21/16 1702    Visit Number 13   Number of Visits 22   Date for PT Re-Evaluation 11/11/16   PT Start Time 1601   PT Stop Time 1647   PT Time Calculation (min) 46 min   Activity Tolerance Patient tolerated treatment well   Behavior During Therapy [facility] for tasks assessed/performed      History reviewed. No pertinent past medical history.  Past Surgical History:  Procedure Laterality Date  . SHOULDER ARTHROSCOPY WITH LABRAL REPAIR Left 08/05/2016   Procedure: LEFT SHOULDER ARTHROSCOPY WITH LABRAL REPAIR;  Surgeon: Meredith Pel, MD;  Location: [facility];  Service: Orthopedics;  Laterality: Left;  . TOOTH EXTRACTION  2008    There were no vitals filed for this visit.      Subjective Assessment - 10/21/16 1701    Subjective Patient has not heard back from coach for information about when/where he will be transferred to for physical therapy upon return to school.    Pertinent History Pt. 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 Prior to surgery pt. sustained anterior dislocation a few years ago and had symptomatic instability since. Pt. had tera of anterior inferior labrum with tear of posterior labrum extending from the 1 oclock position to the 5 oclock position in the left glenoid with intact rotator cuff. Orders request PROM below shoulder level with ER <30, Abd <90, FF <90 and isometrics of RC and deltoid for three weeks. Pt. Is currently only doing 30 clockwise, and 30 counterclockwise pendulums.  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    Limitations Lifting;Reading;Walking;Writing;House hold activities   How long can you sit comfortably? n/a   How long can you stand comfortably? n/a   How long can you walk comfortably? 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 No/denies      Manual:.  PROM with 5x20 second hold each direction: flexion >165, abduction >165, ER wfl, IR wfl  rhythmic perturbation's  Scapular stabilization holding onto BTB while PT pulling and pertubation in all directions     TherEx  Body blade abduction to 90 x 8 Body blade flexion to 90 x 10 Body blade flexion 90 to 170 10x Bear crawl 8x40 ft Circuit: 5 jumps with overhead reach (simulation of rebound), 5 around the world dribble with LUE only, 5 modified burpee pushup with cues for slowing down eccentric portion for improved control. 3x total.   bosu ball blue side up knees on table hands on bosu: PT directed up, half, down of pushup position with patient maintaining each position for multiple trials bosu ball blue side up: knees on table, UE reaches (birdog with only UEs) bosu ball black side up plank 3x30 seconds Back against wall to promote upright posture: LUE flexion arm raise to 90 holding 5lb weight 2x10, cues for shoulder positioning Arnold's with 5lb weights 8x scapular retractions seated in front  of mirror 10x pinching PT's fingers  Frequent verbal cues required for shoulders back upright posture.     Pt. response to medical necessity: Patient will continue to benefit from skilled physical therapy to improve shoulder mobility and strength for return to previous level of function                             PT Education - 10/21/16 1701    Education provided Yes   Education Details upright  posture, plank, stabilization   Person(s) Educated Patient   Methods Explanation;Demonstration;Verbal cues   Comprehension Verbalized understanding;Verbal cues required;Returned demonstration             PT Long Term Goals - 10/08/16 0704      PT LONG TERM GOAL #1   Title Patient will improve shoulder ROM to > 140 degrees of flexion, scaption, and abduction for improved ability to perform overhead activities.   Baseline 8/8:  Flexion: arom=125, abduction arom=131; 7/9pt limited to 90 flexion and abduction per protocol at this time   Time 6   Period Weeks   Status Partially Met   Target Date 11/05/16     PT LONG TERM GOAL #2   Title Patient will decrease Quick DASH score by > 8 (to 5%) points demonstrating reduced self-reported upper extremity disability.   Baseline 8/8: 0%; 7/9: 13.63   Time 6   Period Weeks   Status Achieved     PT LONG TERM GOAL #3   Title Patient will demonstrate LUE strength of 4+/5 to return to competative collegiate basketball    Baseline 8/8: L grossly 4-/5, R 5/5 7/9: will assess in 3 weeks   Time 6   Period Weeks   Status Partially Met     PT LONG TERM GOAL #4   Title Pt. LUE will be equal to RUE strength within 5 lbs allowing pt. to return to weight lifting regime.    Baseline 8/7: LUE weaker than RUE; 7/9: unable to lift at this time   Time 6   Period Weeks   Status On-going     PT LONG TERM GOAL #5   Title Patient will decrease QuickDash sports module to <50% to allow for pt. to return to previous level of function   Baseline 8/8: 100%   Time 6   Period Weeks   Status New   Target Date 11/11/16               Plan - 10/21/16 1702    Clinical Impression Statement Patient progressing with stabilization and functional strength. Overhead motions are challenging to patient at this time due to fatigue. AROM and PROM functional with no noted scapular compensations. Scapular compensations noted in body blade abduction due to fatigue.  Upon rest and re-trial compensations diminished. Education on proper posture continue to be required. Waiting on information from coach about transferring to PT at school.    Rehab Potential Excellent   Clinical Impairments Affecting Rehab Potential  (-) s/p surgery, adherance to wearing sling, (+) desire to return to sport, age   PT Frequency 2x / week   PT Duration 6 weeks   PT Treatment/Interventions ADLs/Self Care Home Management;Biofeedback;Cryotherapy;Electrical Stimulation;Iontophoresis 75m/ml Dexamethasone;Moist Heat;Ultrasound;Therapeutic activities;Functional mobility training;Therapeutic exercise;Neuromuscular re-education;Patient/family education;Manual techniques;Compression bandaging;Scar mobilization;Passive range of motion;Dry needling;Energy conservation;Splinting;Taping;Visual/perceptual remediation/compensation   PT Next Visit Plan new HEP, potential d/c depending upon when returning to school   PT Home Exercise Plan see sheet  Consulted and Agree with Plan of Care Patient      Patient will benefit from skilled therapeutic intervention in order to improve the following deficits and impairments:  Decreased knowledge of precautions, Decreased mobility, Decreased range of motion, Decreased safety awareness, Decreased strength, Decreased scar mobility, Hypomobility, Impaired UE functional use, Improper body mechanics, Postural dysfunction  Visit Diagnosis: Stiffness of left shoulder, not elsewhere classified  Muscle weakness (generalized)  Abnormal posture     Problem List Patient Active Problem List   Diagnosis Date Noted  . History of arthroscopy of left shoulder 09/08/2016   Janna Arch, PT, DPT   Janna Arch 10/21/2016, 5:04 PM  [place] MAIN [facility] SERVICES [address] [place], [place], [address] Phone: [phone]   Fax:  [phone]  Name: FIN HUPP MRN: [id number] Date of Birth: 1995/05/11

## 2016-10-22 ENCOUNTER — Ambulatory Visit (INDEPENDENT_AMBULATORY_CARE_PROVIDER_SITE_OTHER): Payer: 59 | Admitting: Orthopedic Surgery

## 2016-10-22 ENCOUNTER — Encounter (INDEPENDENT_AMBULATORY_CARE_PROVIDER_SITE_OTHER): Payer: Self-pay | Admitting: Orthopedic Surgery

## 2016-10-22 DIAGNOSIS — Z9889 Other specified postprocedural states: Secondary | ICD-10-CM

## 2016-10-22 NOTE — Progress Notes (Signed)
   Post-Op Visit Note   Patient: William Humphrey           Date of Birth: 1995/06/17           MRN: [id number] Visit Date: 10/22/2016 PCP: Leanna Sato, MD   Assessment & Plan:  Chief Complaint:  Chief Complaint  Patient presents with  . Left Shoulder - Routine Post Op   Visit Diagnoses: No diagnosis found.  Plan: [name] is a 21 year old patient who is now almost 10 weeks out left shoulder arthroscopy and labral repair for instability.  He is doing better.  He is in physical therapy 2 times a week.  Does not have any pain.  Still feels like the arm is a little weaker than the right arm.  On exam he has good range of motion with about 10 less external rotation on the left compared to the right with a good solid endpoint.  He has very good anterior and posterior stability.  Less than a centimeter sulcus sign.  Has excellent strength rotator cuff strength testing.  Plan at this time is for him to progress with his therapy.  In 2 weeks which will be 3 months postop it's okay to start strengthening.  Discussed with him at length how to avoid abduction and external rotation particularly on the bench press and pull downs.  I prefer that he start strengthening with machines as opposed to dumbbells.  I think he is okay for contact in early October 4 months status post reconstruction.  Fine for conditioning as well.  I'll see him back around Thanksgiving for clinical recheck.  Follow-Up Instructions: No Follow-up on file.   Orders:  No orders of the defined types were placed in this encounter.  No orders of the defined types were placed in this encounter.   Imaging: No results found.  PMFS History: Patient Active Problem List   Diagnosis Date Noted  . History of arthroscopy of left shoulder 09/08/2016   No past medical history on file.  No family history on file.  Past Surgical History:  Procedure Laterality Date  . SHOULDER ARTHROSCOPY WITH LABRAL REPAIR Left 08/05/2016   Procedure: LEFT SHOULDER ARTHROSCOPY WITH LABRAL REPAIR;  Surgeon: Cammy Copa, MD;  Location: [facility] OR;  Service: Orthopedics;  Laterality: Left;  . TOOTH EXTRACTION  2008   Social History   Occupational History  . Not on file.   Social History Main Topics  . Smoking status: Never Smoker  . Smokeless tobacco: Never Used  . Alcohol use No  . Drug use: No  . Sexual activity: Not on file

## 2016-10-23 ENCOUNTER — Ambulatory Visit: Payer: 59

## 2016-10-23 DIAGNOSIS — M6281 Muscle weakness (generalized): Secondary | ICD-10-CM

## 2016-10-23 DIAGNOSIS — R293 Abnormal posture: Secondary | ICD-10-CM | POA: Diagnosis not present

## 2016-10-23 DIAGNOSIS — M25612 Stiffness of left shoulder, not elsewhere classified: Secondary | ICD-10-CM | POA: Diagnosis not present

## 2016-10-23 NOTE — Therapy (Signed)
[place] [facility] REHAB SERVICES [address] [place], [place], [address] Phone: [phone]   Fax:  [phone]  Physical Therapy Treatment  Patient Details  Name: William Humphrey MRN: [id number] Date of Birth: 11/28/1995 Referring Provider: Beane, Jeffrey, MD  Encounter Date: 10/23/2016      PT End of Session - 10/23/16 2216    Visit Number 14   Number of Visits 22   Date for PT Re-Evaluation 11/11/16   PT Start Time 1646   PT Stop Time 1731   PT Time Calculation (min) 45 min   Activity Tolerance Patient tolerated treatment well   Behavior During Therapy WFL for tasks assessed/performed      History reviewed. No pertinent past medical history.  Past Surgical History:  Procedure Laterality Date  . SHOULDER ARTHROSCOPY WITH LABRAL REPAIR Left 08/05/2016   Procedure: LEFT SHOULDER ARTHROSCOPY WITH LABRAL REPAIR;  Surgeon: Dean, Scott Gregory, MD;  Location: [facility] OR;  Service: Orthopedics;  Laterality: Left;  . TOOTH EXTRACTION  2008    There were no vitals filed for this visit.      Subjective Assessment - 10/23/16 2214    Subjective Patient went to doctor yesterday and has been cleared for strengthening with preference for machines not free weights. Patient had discussion with coach, adviser, and doctor and decided to attend local college for this semester until therpay and shoulder healed. Will return next semester   Pertinent History Pt. 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 Prior to surgery pt. sustained anterior dislocation a few years ago and had symptomatic instability since. Pt. had tera of anterior inferior labrum with tear of posterior labrum extending from the 1 oclock position to the 5 oclock position in the left glenoid with intact rotator cuff. Orders request PROM below shoulder level with ER  <30, Abd <90, FF <90 and isometrics of RC and deltoid for three weeks. Pt. Is currently only doing 30 clockwise, and 30 counterclockwise pendulums. 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    Limitations Lifting;Reading;Walking;Writing;House hold activities   How long can you sit comfortably? n/a   How long can you stand comfortably? n/a   How long can you walk comfortably? 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 No/denies     staying till January   TherEx Bear crawl 8x40 ft Overhead ball dribble with side step x 6 Circuit: stop 1: YTB stabilization ER, IR stop 2: body blade  flex to 90, abd to 90, cross body to 90, 3. Forward and backwards circles with basketball on wall, 4. Plank on bosu ball 5. Shoulder flexion with 10 second holds 10x with back against wall to decrease compensation   Manual  PROMwith5x20 second holdeach direction: flexion >165, abduction >165, ER wfl, IR wfl with scapular holds rhythmic perturbation's  Rhythmic rotation Cervical side bend to right with shoulder overpressure to relieve upper trap tightness.    Frequent verbal cues required for shoulders back upright posture.   Pt. response to medical necessity: Patient will continue to benefit from skilled physical therapy to improve shoulder mobility and strength for return to previous level of function                          PT Education - 10/23/16 2215    Education provided Yes   Education Details upright posture, safe mechanics   Person(s) Educated Patient   Methods Explanation;Demonstration;Verbal cues   Comprehension Verbalized understanding;Returned demonstration;Verbal cues required             PT Long Term Goals - 10/08/16 0704      PT LONG TERM GOAL #1   Title Patient will improve shoulder  ROM to > 140 degrees of flexion, scaption, and abduction for improved ability to perform overhead activities.   Baseline 8/8:  Flexion: arom=125, abduction arom=131; 7/9pt limited to 90 flexion and abduction per protocol at this time   Time 6   Period Weeks   Status Partially Met   Target Date 11/05/16     PT LONG TERM GOAL #2   Title Patient will decrease Quick DASH score by > 8 (to 5%) points demonstrating reduced self-reported upper extremity disability.   Baseline 8/8: 0%; 7/9: 13.63   Time 6   Period Weeks   Status Achieved     PT LONG TERM GOAL #3   Title Patient will demonstrate LUE strength of 4+/5 to return to competative collegiate basketball    Baseline 8/8: L grossly 4-/5, R 5/5 7/9: will assess in 3 weeks   Time 6   Period Weeks   Status Partially Met     PT LONG TERM GOAL #4   Title Pt. LUE will be equal to RUE strength within 5 lbs allowing pt. to return to weight lifting regime.    Baseline 8/7: LUE weaker than RUE; 7/9: unable to lift at this time   Time 6   Period Weeks   Status On-going     PT LONG TERM GOAL #5   Title Patient will decrease QuickDash sports module to <50% to allow for pt. to return to previous level of function   Baseline 8/8: 100%   Time 6   Period Weeks   Status New   Target Date 11/11/16               Plan - 10/23/16 2218    Clinical Impression Statement Patient post surgical shoulder fatigues quickly due to limited capacity for functional strengthening at this time. Circuit was benificial to patient to allow for strengthening and ROM in a moderate intensity. Patient will continue to benefit from skilled physical therapy to improve shoulder mobility and strength for return to previous level of function   Rehab Potential Excellent   Clinical Impairments Affecting Rehab Potential  (-) s/p surgery, adherance to wearing sling, (+) desire to return to sport, age   PT Frequency 2x / week   PT Duration 6 weeks   PT  Treatment/Interventions ADLs/Self Care Home Management;Biofeedback;Cryotherapy;Electrical Stimulation;Iontophoresis 55m/ml Dexamethasone;Moist Heat;Ultrasound;Therapeutic activities;Functional mobility training;Therapeutic exercise;Neuromuscular re-education;Patient/family education;Manual techniques;Compression bandaging;Scar mobilization;Passive range of motion;Dry needling;Energy conservation;Splinting;Taping;Visual/perceptual remediation/compensation   PT Next Visit Plan new HEP, potential d/c depending upon when returning to school   PT Home Exercise Plan see sheet   Consulted and Agree with Plan of Care Patient      Patient will benefit from skilled therapeutic intervention in order to improve the following deficits and impairments:  Decreased knowledge of precautions, Decreased mobility, Decreased range of motion, Decreased safety awareness, Decreased strength, Decreased scar mobility, Hypomobility, Impaired UE functional use, Improper body mechanics, Postural dysfunction  Visit Diagnosis: Stiffness of left shoulder, not elsewhere classified  Muscle weakness (generalized)  Abnormal posture     Problem List Patient Active Problem  List   Diagnosis Date Noted  . History of arthroscopy of left shoulder 09/08/2016   Janna Arch, PT, DPT   Janna Arch 10/23/2016, 10:19 PM  [place] MAIN [facility] SERVICES [address] [place], [place], [address] Phone: [phone]   Fax:  [phone]  Name: William Humphrey MRN: [id number] Date of Birth: 02-04-1996

## 2016-10-27 ENCOUNTER — Encounter: Payer: 59 | Admitting: Physical Therapy

## 2016-10-27 ENCOUNTER — Ambulatory Visit: Payer: 59

## 2016-10-27 DIAGNOSIS — M6281 Muscle weakness (generalized): Secondary | ICD-10-CM | POA: Diagnosis not present

## 2016-10-27 DIAGNOSIS — M25612 Stiffness of left shoulder, not elsewhere classified: Secondary | ICD-10-CM | POA: Diagnosis not present

## 2016-10-27 DIAGNOSIS — R293 Abnormal posture: Secondary | ICD-10-CM

## 2016-10-27 NOTE — Therapy (Signed)
[place] MAIN [facility] SERVICES [address] [place], [place], [address] Phone: [phone]   Fax:  [phone]  Physical Therapy Treatment  Patient Details  Name: William Humphrey MRN: [id number] Date of Birth: 06/14/1995 Referring Provider: [name], MD  Encounter Date: 10/27/2016      PT End of Session - 10/27/16 1751    Visit Number 15   Number of Visits 22   Date for PT Re-Evaluation 11/11/16   PT Start Time 1601   PT Stop Time 1102   PT Time Calculation (min) 44 min   Activity Tolerance Patient tolerated treatment well   Behavior During Therapy [facility] for tasks assessed/performed      History reviewed. No pertinent past medical history.  Past Surgical History:  Procedure Laterality Date  . SHOULDER ARTHROSCOPY WITH LABRAL REPAIR Left 08/05/2016   Procedure: LEFT SHOULDER ARTHROSCOPY WITH LABRAL REPAIR;  Surgeon: Meredith Pel, MD;  Location: [facility];  Service: Orthopedics;  Laterality: Left;  . TOOTH EXTRACTION  2008    There were no vitals filed for this visit.      Subjective Assessment - 10/27/16 1604    Subjective Patient will start classes next week. Started leg day at the gym.    Pertinent History Pt. 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 Prior to surgery pt. sustained anterior dislocation a few years ago and had symptomatic instability since. Pt. had tera of anterior inferior labrum with tear of posterior labrum extending from the 1 oclock position to the 5 oclock position in the left glenoid with intact rotator cuff. Orders request PROM below shoulder level with ER <30, Abd <90, FF <90 and isometrics of RC and deltoid for three weeks. Pt. Is currently only doing 30 clockwise, and 30 counterclockwise pendulums. Patient to return to playing competitive basketball at [employer] in  [place] where he plays shooting guard.    Limitations Lifting;Reading;Walking;Writing;House hold activities   How long can you sit comfortably? n/a   How long can you stand comfortably? n/a   How long can you walk comfortably? 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 No/denies     Matrix machine IR/ ER 12.5 12-15x cues for slowing down: count out loud to 3, , tricep pushdown  12x 12.5lb, 10x with bent over position 7.5 lb, seated row 7.5lb with 5 second holds 10x, 17.5lb x 12 with 5 second holds, bent over row with bar matrix 17.5lb overhand position 12x, underhand position 12x  Plank with UE reach claps: 10x  Body blade abduction to 90 x 10 Body blade abduction 90 to 160 10x Body blade flexion to 90 x 10 Body blade flexion 90 to 170 10x Bird dogs quadruped on table. Good shoulder control bosu ball blue side up knees on table hands on bosu: PT directed up, half, down of pushup position with patient maintaining each position for multiple trials  Manual ER PROM  rhythmic perturbation's  subscap STM Pt. response to medical necessity: Patient will continue to benefit from skilled physical therapy to improve shoulder mobility and strength for return to previous level of function .  PT Education - 10/27/16 1751    Education provided Yes   Education Details Economist for strengthening   Person(s) Educated Patient   Methods Explanation;Demonstration   Comprehension Verbalized understanding;Returned demonstration             PT Long Term Goals - 10/08/16 0704      PT LONG TERM GOAL #1   Title Patient will improve shoulder ROM to > 140 degrees of flexion, scaption, and abduction for improved ability to perform overhead activities.   Baseline 8/8:  Flexion: arom=125, abduction arom=131; 7/9pt limited to 90 flexion and  abduction per protocol at this time   Time 6   Period Weeks   Status Partially Met   Target Date 11/05/16     PT LONG TERM GOAL #2   Title Patient will decrease Quick DASH score by > 8 (to 5%) points demonstrating reduced self-reported upper extremity disability.   Baseline 8/8: 0%; 7/9: 13.63   Time 6   Period Weeks   Status Achieved     PT LONG TERM GOAL #3   Title Patient will demonstrate LUE strength of 4+/5 to return to competative collegiate basketball    Baseline 8/8: L grossly 4-/5, R 5/5 7/9: will assess in 3 weeks   Time 6   Period Weeks   Status Partially Met     PT LONG TERM GOAL #4   Title Pt. LUE will be equal to RUE strength within 5 lbs allowing pt. to return to weight lifting regime.    Baseline 8/7: LUE weaker than RUE; 7/9: unable to lift at this time   Time 6   Period Weeks   Status On-going     PT LONG TERM GOAL #5   Title Patient will decrease QuickDash sports module to <50% to allow for pt. to return to previous level of function   Baseline 8/8: 100%   Time 6   Period Weeks   Status New   Target Date 11/11/16               Plan - 10/27/16 1753    Clinical Impression Statement Patient requires frequent verbal cueing for postural correction due to preference for rounded shoulders. Review of body mechanics for functional rotator cuff strengthening on matrix machine performed with patient required to count out loud to decrease speed of movement.  Patient will continue to benefit from skilled physical therapy to improve shoulder mobility and strength for return to previous level of function .   Rehab Potential Excellent   Clinical Impairments Affecting Rehab Potential  (-) s/p surgery, adherance to wearing sling, (+) desire to return to sport, age   PT Frequency 2x / week   PT Duration 6 weeks   PT Treatment/Interventions ADLs/Self Care Home Management;Biofeedback;Cryotherapy;Electrical Stimulation;Iontophoresis 39m/ml Dexamethasone;Moist  Heat;Ultrasound;Therapeutic activities;Functional mobility training;Therapeutic exercise;Neuromuscular re-education;Patient/family education;Manual techniques;Compression bandaging;Scar mobilization;Passive range of motion;Dry needling;Energy conservation;Splinting;Taping;Visual/perceptual remediation/compensation   PT Next Visit Plan ball, body blade, matrix   PT Home Exercise Plan see sheet   Consulted and Agree with Plan of Care Patient      Patient will benefit from skilled therapeutic intervention in order to improve the following deficits and impairments:  Decreased knowledge of precautions, Decreased mobility, Decreased range of motion, Decreased safety awareness, Decreased strength, Decreased scar mobility, Hypomobility, Impaired UE functional use, Improper body mechanics, Postural dysfunction  Visit Diagnosis: Stiffness of left shoulder, not elsewhere classified  Muscle weakness (generalized)  Abnormal posture     Problem List Patient Active  Problem List   Diagnosis Date Noted  . History of arthroscopy of left shoulder 09/08/2016   Janna Arch, PT, DPT   Janna Arch 10/27/2016, 5:54 PM  [place] MAIN [facility] SERVICES [address] [place], [place], [address] Phone: [phone]   Fax:  [phone]  Name: William Humphrey MRN: [id number] Date of Birth: [birth year]

## 2016-10-27 NOTE — Patient Instructions (Signed)
Bosu ball blue side up: ON KNEES, Half, down, or up Start at up: hold 8 seconds Down hold 8 seconds Middle hold 8 seconds Up 8 seconds Middle 8 seconds Down 8 seconds Middle 8 seconds Up 8 seconds Down 8 seconds Middle 8 seconds Down 8 seconds Up 8 seconds  Repeat as wanted

## 2016-10-30 ENCOUNTER — Ambulatory Visit: Payer: 59

## 2016-10-30 DIAGNOSIS — M6281 Muscle weakness (generalized): Secondary | ICD-10-CM | POA: Diagnosis not present

## 2016-10-30 DIAGNOSIS — R293 Abnormal posture: Secondary | ICD-10-CM | POA: Diagnosis not present

## 2016-10-30 DIAGNOSIS — M25612 Stiffness of left shoulder, not elsewhere classified: Secondary | ICD-10-CM

## 2016-10-30 NOTE — Therapy (Signed)
[place] MAIN [facility] SERVICES [address] [place], [place], [address] Phone: [phone]   Fax:  [phone]  Physical Therapy Treatment  Patient Details  Name: William Humphrey MRN: [id number] Date of Birth: 1995-12-31 Referring Provider: [name], MD  Encounter Date: 10/30/2016      PT End of Session - 10/30/16 1655    Visit Number 16   Number of Visits 22   Date for PT Re-Evaluation 11/11/16   PT Start Time 1600   PT Stop Time 1645   PT Time Calculation (min) 45 min   Activity Tolerance Patient tolerated treatment well   Behavior During Therapy [facility] for tasks assessed/performed      History reviewed. No pertinent past medical history.  Past Surgical History:  Procedure Laterality Date  . SHOULDER ARTHROSCOPY WITH LABRAL REPAIR Left 08/05/2016   Procedure: LEFT SHOULDER ARTHROSCOPY WITH LABRAL REPAIR;  Surgeon: Meredith Pel, MD;  Location: [place];  Service: Orthopedics;  Laterality: Left;  . TOOTH EXTRACTION  2008    There were no vitals filed for this visit.      Subjective Assessment - 10/30/16 1654    Subjective Patient went to gym yesterday and has no soreness today. Will start classes next week.    Pertinent History Pt. 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 Prior to surgery pt. sustained anterior dislocation a few years ago and had symptomatic instability since. Pt. had tera of anterior inferior labrum with tear of posterior labrum extending from the 1 oclock position to the 5 oclock position in the left glenoid with intact rotator cuff. Orders request PROM below shoulder level with ER <30, Abd <90, FF <90 and isometrics of RC and deltoid for three weeks. Pt. Is currently only doing 30 clockwise, and 30 counterclockwise pendulums. 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    Limitations Lifting;Reading;Walking;Writing;House hold activities   How long can you sit comfortably? n/a   How long can you stand comfortably? n/a   How long can you walk comfortably? 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 No/denies     Bear Crawls 20 ft x 6 Ball pass with basketball with focus on proper body mechanics when catching and releasing ball in all directions, inside and outside [place] 1: 1. Body blade abduction to 90 x 7, flexion to 90 x 7, 2. Row boat x 10, 3. Slow eccentric pushup count 5 with shot on top with basketball x 8, wall walks with GTB around wrists x 6  Performed 3x Circuit 2: 1, horizontal adduction with body blade x 7, 2. Plank with forward arm reach on sliders x 5 each arm, 3. supine IR ER with GTB,x10 4. Ball pass with basketball   Performed 2x AAROM with PVC pipe flexion x 10 with 10 second holds, ER with 10 second holds x 10   Pt. response to medical necessity: Patient will continue to benefit from skilled physical therapy to improve shoulder mobility and strength for return to previous level of function .                          PT Education - 10/30/16 1655  Education provided Yes   Education Details Economist for strengthening   Person(s) Educated Patient   Methods Demonstration;Explanation;Verbal cues   Comprehension Verbalized understanding;Returned demonstration             PT Long Term Goals - 10/08/16 0704      PT LONG TERM GOAL #1   Title Patient will improve shoulder ROM to > 140 degrees of flexion, scaption, and abduction for improved ability to perform overhead activities.   Baseline 8/8:  Flexion: arom=125, abduction arom=131; 7/9pt limited to 90 flexion and abduction per protocol at this time   Time 6   Period Weeks   Status  Partially Met   Target Date 11/05/16     PT LONG TERM GOAL #2   Title Patient will decrease Quick DASH score by > 8 (to 5%) points demonstrating reduced self-reported upper extremity disability.   Baseline 8/8: 0%; 7/9: 13.63   Time 6   Period Weeks   Status Achieved     PT LONG TERM GOAL #3   Title Patient will demonstrate LUE strength of 4+/5 to return to competative collegiate basketball    Baseline 8/8: L grossly 4-/5, R 5/5 7/9: will assess in 3 weeks   Time 6   Period Weeks   Status Partially Met     PT LONG TERM GOAL #4   Title Pt. LUE will be equal to RUE strength within 5 lbs allowing pt. to return to weight lifting regime.    Baseline 8/7: LUE weaker than RUE; 7/9: unable to lift at this time   Time 6   Period Weeks   Status On-going     PT LONG TERM GOAL #5   Title Patient will decrease QuickDash sports module to <50% to allow for pt. to return to previous level of function   Baseline 8/8: 100%   Time 6   Period Weeks   Status New   Target Date 11/11/16               Plan - 10/30/16 1701    Clinical Impression Statement Patient continuing to progress with functional strength of LUE. Stability strengthening interventions were performed with minimal cueing needed for posture and body mechanics. Overhead motions continue to challenge patient with shoulder fatiguing quickly. Patient will continue to benefit from skilled physical therapy to improve shoulder mobility and strength for return to previous level of function    Rehab Potential Excellent   Clinical Impairments Affecting Rehab Potential  (-) s/p surgery, adherance to wearing sling, (+) desire to return to sport, age   PT Frequency 2x / week   PT Duration 6 weeks   PT Treatment/Interventions ADLs/Self Care Home Management;Biofeedback;Cryotherapy;Electrical Stimulation;Iontophoresis 68m/ml Dexamethasone;Moist Heat;Ultrasound;Therapeutic activities;Functional mobility training;Therapeutic  exercise;Neuromuscular re-education;Patient/family education;Manual techniques;Compression bandaging;Scar mobilization;Passive range of motion;Dry needling;Energy conservation;Splinting;Taping;Visual/perceptual remediation/compensation   PT Next Visit Plan ball, body blade, matrix   PT Home Exercise Plan see sheet   Consulted and Agree with Plan of Care Patient      Patient will benefit from skilled therapeutic intervention in order to improve the following deficits and impairments:  Decreased knowledge of precautions, Decreased mobility, Decreased range of motion, Decreased safety awareness, Decreased strength, Decreased scar mobility, Hypomobility, Impaired UE functional use, Improper body mechanics, Postural dysfunction  Visit Diagnosis: Stiffness of left shoulder, not elsewhere classified  Muscle weakness (generalized)  Abnormal posture     Problem List Patient Active Problem List   Diagnosis Date Noted  . History of arthroscopy of left  shoulder 09/08/2016   Janna Arch, PT, DPT   Janna Arch 10/30/2016, 5:02 PM  [place] MAIN [facility] SERVICES [address] [place], [place], [address] Phone: [phone]   Fax:  [phone]  Name: William Humphrey MRN: [id number] Date of Birth: 1996-01-17

## 2016-11-04 ENCOUNTER — Ambulatory Visit: Payer: 59 | Attending: Specialist

## 2016-11-04 DIAGNOSIS — M6281 Muscle weakness (generalized): Secondary | ICD-10-CM | POA: Insufficient documentation

## 2016-11-04 DIAGNOSIS — M25612 Stiffness of left shoulder, not elsewhere classified: Secondary | ICD-10-CM | POA: Insufficient documentation

## 2016-11-04 DIAGNOSIS — R293 Abnormal posture: Secondary | ICD-10-CM | POA: Insufficient documentation

## 2016-11-04 NOTE — Therapy (Signed)
[place] MAIN [facility] SERVICES [address] [place], [place], [address] Phone: [phone]   Fax:  [phone]  Physical Therapy Treatment  Patient Details  Name: William Humphrey MRN: [id number] Date of Birth: 09-12-1995 Referring Provider: [name], MD  Encounter Date: 11/04/2016      PT End of Session - 11/04/16 1740    Visit Number 17   Number of Visits 22   Date for PT Re-Evaluation 11/11/16   PT Start Time 1656   PT Stop Time 1735   PT Time Calculation (min) 39 min   Activity Tolerance Patient tolerated treatment well   Behavior During Therapy [facility] for tasks assessed/performed      History reviewed. No pertinent past medical history.  Past Surgical History:  Procedure Laterality Date  . SHOULDER ARTHROSCOPY WITH LABRAL REPAIR Left 08/05/2016   Procedure: LEFT SHOULDER ARTHROSCOPY WITH LABRAL REPAIR;  Surgeon: Meredith Pel, MD;  Location: [place];  Service: Orthopedics;  Laterality: Left;  . TOOTH EXTRACTION  2008    There were no vitals filed for this visit.  Manual:       Subjective Assessment - 11/04/16 1739    Subjective Patient shot his basketball with his brother this weekend with no residual soreness. Classes started this week.    Pertinent History Pt. 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 Prior to surgery pt. sustained anterior dislocation a few years ago and had symptomatic instability since. Pt. had tera of anterior inferior labrum with tear of posterior labrum extending from the 1 oclock position to the 5 oclock position in the left glenoid with intact rotator cuff. Orders request PROM below shoulder level with ER <30, Abd <90, FF <90 and isometrics of RC and deltoid for three weeks. Pt. Is currently only doing 30 clockwise, and 30 counterclockwise pendulums. 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    Limitations Lifting;Reading;Walking;Writing;House hold activities   How long can you sit comfortably? n/a   How long can you stand comfortably? n/a   How long can you walk comfortably? 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 No/denies     Circuit: 1. Body blade flexion with 5 second holds overhead, abduction with 5 second holds overhead, cross body with 5 seconds at top (7x), 2. Scarecrow ER standing no weights 8x 3. Basketball passes outside of BOS, 4. Up, middle, down holds quadruped pushups on bosu with holds at each level based on PT directions   Manual PROM with scapular stabilization during flexion 5x 20 second, abduction 3x30 seconds, ER 3x30 seconds, IR 3x30 seconds Grade I-III mobilizations AP, PA, inferior 2x15 seconds Grade I-III mobilizations of thoracic spine CPAs and UPAs 4x30 seconds for posture correction     Pt. response to medical necessity: Patient will continue to benefit from skilled physical therapy to improve shoulder mobility and strength for return to previous level of function                     PT Education - 11/04/16 1739    Education provided Yes   Education Details functional strenghtening of [place] joint in safe body mechanics   Person(s) Educated Patient   Methods Explanation;Demonstration  Comprehension Verbalized understanding;Returned demonstration             PT Long Term Goals - 10/08/16 0704      PT LONG TERM GOAL #1   Title Patient will improve shoulder ROM to > 140 degrees of flexion, scaption, and abduction for improved ability to perform overhead activities.   Baseline 8/8:  Flexion: arom=125, abduction arom=131; 7/9pt limited to 90 flexion and abduction per protocol at this time   Time 6   Period Weeks   Status  Partially Met   Target Date 11/05/16     PT LONG TERM GOAL #2   Title Patient will decrease Quick DASH score by > 8 (to 5%) points demonstrating reduced self-reported upper extremity disability.   Baseline 8/8: 0%; 7/9: 13.63   Time 6   Period Weeks   Status Achieved     PT LONG TERM GOAL #3   Title Patient will demonstrate LUE strength of 4+/5 to return to competative collegiate basketball    Baseline 8/8: L grossly 4-/5, R 5/5 7/9: will assess in 3 weeks   Time 6   Period Weeks   Status Partially Met     PT LONG TERM GOAL #4   Title Pt. LUE will be equal to RUE strength within 5 lbs allowing pt. to return to weight lifting regime.    Baseline 8/7: LUE weaker than RUE; 7/9: unable to lift at this time   Time 6   Period Weeks   Status On-going     PT LONG TERM GOAL #5   Title Patient will decrease QuickDash sports module to <50% to allow for pt. to return to previous level of function   Baseline 8/8: 100%   Time 6   Period Weeks   Status New   Target Date 11/11/16               Plan - 11/04/16 1741    Clinical Impression Statement Patient progressing with functional ROM and strength. Overhead motions continue to challenge patient as well as sustained held positions. Patient mobilizations have normal end range of [place] joint. Postural correction implemented with patient demonstrated improved shoulder mobility after. Patient will continue to benefit from skilled physical therapy to improve shoulder mobility and strength for return to previous level of function   Rehab Potential Excellent   Clinical Impairments Affecting Rehab Potential  (-) s/p surgery, adherance to wearing sling, (+) desire to return to sport, age   PT Frequency 2x / week   PT Duration 6 weeks   PT Treatment/Interventions ADLs/Self Care Home Management;Biofeedback;Cryotherapy;Electrical Stimulation;Iontophoresis 8m/ml Dexamethasone;Moist Heat;Ultrasound;Therapeutic activities;Functional mobility  training;Therapeutic exercise;Neuromuscular re-education;Patient/family education;Manual techniques;Compression bandaging;Scar mobilization;Passive range of motion;Dry needling;Energy conservation;Splinting;Taping;Visual/perceptual remediation/compensation   PT Next Visit Plan ball, body blade, matrix   PT Home Exercise Plan see sheet   Consulted and Agree with Plan of Care Patient      Patient will benefit from skilled therapeutic intervention in order to improve the following deficits and impairments:  Decreased knowledge of precautions, Decreased mobility, Decreased range of motion, Decreased safety awareness, Decreased strength, Decreased scar mobility, Hypomobility, Impaired UE functional use, Improper body mechanics, Postural dysfunction  Visit Diagnosis: Stiffness of left shoulder, not elsewhere classified  Muscle weakness (generalized)  Abnormal posture     Problem List Patient Active Problem List   Diagnosis Date Noted  . History of arthroscopy of left shoulder 09/08/2016   MJanna Arch PT, DPT   MJanna Arch9/06/2016, 5:42 PM  [place] MAIN [facility] SERVICES [place], [place], [address] Phone: [phone]   Fax:  [phone]  Name: JERAL ZICK MRN: [id number] Date of Birth: 03-11-1995

## 2016-11-06 ENCOUNTER — Ambulatory Visit: Payer: 59

## 2016-11-06 DIAGNOSIS — M25612 Stiffness of left shoulder, not elsewhere classified: Secondary | ICD-10-CM | POA: Diagnosis not present

## 2016-11-06 DIAGNOSIS — R293 Abnormal posture: Secondary | ICD-10-CM | POA: Diagnosis not present

## 2016-11-06 DIAGNOSIS — M6281 Muscle weakness (generalized): Secondary | ICD-10-CM | POA: Diagnosis not present

## 2016-11-06 NOTE — Therapy (Signed)
[place] MAIN [facility] SERVICES [address] [place], [place], [address] Phone: [phone]   Fax:  [phone]  Physical Therapy Treatment  Patient Details  Name: William Humphrey MRN: [id number] Date of Birth: [birth year] Referring Provider: [name], MD  Encounter Date: 11/06/2016      PT End of Session - 11/06/16 1802    Visit Number 18   Number of Visits 22   Date for PT Re-Evaluation 11/11/16   PT Start Time 0233   PT Stop Time 1733   PT Time Calculation (min) 43 min   Activity Tolerance Patient tolerated treatment well   Behavior During Therapy [facility] for tasks assessed/performed      History reviewed. No pertinent past medical history.  Past Surgical History:  Procedure Laterality Date  . SHOULDER ARTHROSCOPY WITH LABRAL REPAIR Left 08/05/2016   Procedure: LEFT SHOULDER ARTHROSCOPY WITH LABRAL REPAIR;  Surgeon: Meredith Pel, MD;  Location: [place];  Service: Orthopedics;  Laterality: Left;  . TOOTH EXTRACTION  2008    There were no vitals filed for this visit.      Subjective Assessment - 11/06/16 1801    Subjective Patient planning on practicing shots with brother this weekend. Has had no pain recently.    Pertinent History Pt. 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 Prior to surgery pt. sustained anterior dislocation a few years ago and had symptomatic instability since. Pt. had tera of anterior inferior labrum with tear of posterior labrum extending from the 1 oclock position to the 5 oclock position in the left glenoid with intact rotator cuff. Orders request PROM below shoulder level with ER <30, Abd <90, FF <90 and isometrics of RC and deltoid for three weeks. Pt. Is currently only doing 30 clockwise, and 30 counterclockwise pendulums. Patient to return to playing competitive basketball  at [employer] in [place] where he plays shooting guard.    Limitations Lifting;Reading;Walking;Writing;House hold activities   How long can you sit comfortably? n/a   How long can you stand comfortably? n/a   How long can you walk comfortably? 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 No/denies       Circuit 1: 3x through: 1. Flexion to 90 body blade, abduction to 90 body blade, 2. Arms in flexion against wall bouncing basketball 3. Standing scarecrows (Er IR), 4. Standing with UE flexed above head hold for 10 seconds x 10, 5. Bear crawl 60 ft x 6 Circuit 2: 2x through: 1. Body blade overhead 10x seconds, 2. Ball pass  In long sit position, 3. Snow Psychologist, educational ER, 4. Plank with hand claps  manual PROM flexion with scapular stabilization, AB, ER, IR with 30 second holds   Patient will continue to benefit from skilled physical therapy to improve shoulder mobility and strength for return to previous level of function .                         PT Education - 11/06/16 1801    Education provided Yes   Education Details functional strengthening and ROM   Person(s) Educated Patient   Methods Explanation;Demonstration   Comprehension Verbalized understanding;Returned demonstration             PT Long Term  Goals - 10/08/16 0704      PT LONG TERM GOAL #1   Title Patient will improve shoulder ROM to > 140 degrees of flexion, scaption, and abduction for improved ability to perform overhead activities.   Baseline 8/8:  Flexion: arom=125, abduction arom=131; 7/9pt limited to 90 flexion and abduction per protocol at this time   Time 6   Period Weeks   Status Partially Met   Target Date 11/05/16     PT LONG TERM GOAL #2   Title Patient will decrease Quick DASH score by > 8 (to 5%) points demonstrating reduced self-reported upper extremity disability.   Baseline  8/8: 0%; 7/9: 13.63   Time 6   Period Weeks   Status Achieved     PT LONG TERM GOAL #3   Title Patient will demonstrate LUE strength of 4+/5 to return to competative collegiate basketball    Baseline 8/8: L grossly 4-/5, R 5/5 7/9: will assess in 3 weeks   Time 6   Period Weeks   Status Partially Met     PT LONG TERM GOAL #4   Title Pt. LUE will be equal to RUE strength within 5 lbs allowing pt. to return to weight lifting regime.    Baseline 8/7: LUE weaker than RUE; 7/9: unable to lift at this time   Time 6   Period Weeks   Status On-going     PT LONG TERM GOAL #5   Title Patient will decrease QuickDash sports module to <50% to allow for pt. to return to previous level of function   Baseline 8/8: 100%   Time 6   Period Weeks   Status New   Target Date 11/11/16               Plan - 11/06/16 1803    Clinical Impression Statement Patient is challenged by overhead prolonged motion due to fatigue in weak shoulder musculature. Duration of prolonged hold is improving at this time. Shoulder musculature near full ROM with slight limitation in ER at 90 90. Patient will continue to benefit from skilled physical therapy to improve shoulder mobility and strength for return to previous level of function   Rehab Potential Excellent   Clinical Impairments Affecting Rehab Potential  (-) s/p surgery, adherance to wearing sling, (+) desire to return to sport, age   PT Frequency 2x / week   PT Duration 6 weeks   PT Treatment/Interventions ADLs/Self Care Home Management;Biofeedback;Cryotherapy;Electrical Stimulation;Iontophoresis 28m/ml Dexamethasone;Moist Heat;Ultrasound;Therapeutic activities;Functional mobility training;Therapeutic exercise;Neuromuscular re-education;Patient/family education;Manual techniques;Compression bandaging;Scar mobilization;Passive range of motion;Dry needling;Energy conservation;Splinting;Taping;Visual/perceptual remediation/compensation   PT Next Visit Plan ball,  body blade, matrix   PT Home Exercise Plan see sheet   Consulted and Agree with Plan of Care Patient      Patient will benefit from skilled therapeutic intervention in order to improve the following deficits and impairments:  Decreased knowledge of precautions, Decreased mobility, Decreased range of motion, Decreased safety awareness, Decreased strength, Decreased scar mobility, Hypomobility, Impaired UE functional use, Improper body mechanics, Postural dysfunction  Visit Diagnosis: Stiffness of left shoulder, not elsewhere classified  Muscle weakness (generalized)  Abnormal posture     Problem List Patient Active Problem List   Diagnosis Date Noted  . History of arthroscopy of left shoulder 09/08/2016   MJanna Arch PT, DPT   MJanna Arch9/08/2016, 6:04 PM  [place] [address] [address]Phone: [phone]  Fax:  [phone]  Name: William Humphrey MRN: [id number] Date of Birth: 1995/11/21

## 2016-11-11 ENCOUNTER — Ambulatory Visit: Payer: 59

## 2016-11-12 ENCOUNTER — Ambulatory Visit: Payer: 59

## 2016-11-12 DIAGNOSIS — M6281 Muscle weakness (generalized): Secondary | ICD-10-CM

## 2016-11-12 DIAGNOSIS — M25612 Stiffness of left shoulder, not elsewhere classified: Secondary | ICD-10-CM | POA: Diagnosis not present

## 2016-11-12 DIAGNOSIS — R293 Abnormal posture: Secondary | ICD-10-CM

## 2016-11-12 NOTE — Therapy (Signed)
[place] [facility] [address] [place], [place], [address] Phone: [phone]   Fax:  [phone]  Physical Therapy Treatment  Patient Details  Name: Malvin Johnsrenton A Lesueur MRN: [id number] Date of Birth: 09/22/1995 Referring Provider: Jene EveryBeane, Jeffrey, MD  Encounter Date: 11/12/2016      PT End of Session - 11/12/16 1535    Visit Number 19   Number of Visits 34   Date for PT Re-Evaluation 12/24/16   PT Start Time 1349   PT Stop Time 1430   PT Time Calculation (min) 41 min   Activity Tolerance Patient tolerated treatment well   Behavior [facility] Therapy [facility] for tasks assessed/performed      History reviewed. No pertinent past medical history.  Past Surgical History:  Procedure Laterality Date  . SHOULDER ARTHROSCOPY WITH LABRAL REPAIR Left 08/05/2016   Procedure: LEFT SHOULDER ARTHROSCOPY WITH LABRAL REPAIR;  Surgeon: Cammy Copaean, Scott Gregory, MD;  [facility];  Service: Orthopedics;  Laterality: Left;  . TOOTH EXTRACTION  2008    There were no vitals filed for this visit.      Subjective Assessment - 11/12/16 1354    Subjective Patient feeling better today, was sick earlier in the week. Has had no pain recently.    Pertinent History Pt. 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 Prior to surgery pt. sustained anterior dislocation a few years ago and had symptomatic instability since. Pt. had tera of anterior inferior labrum with tear of posterior labrum extending from the 1 oclock position to the 5 oclock position in the left glenoid with intact rotator cuff. Orders request PROM below shoulder level with ER <30, Abd <90, FF <90 and isometrics of RC and deltoid for three weeks. Pt. Is currently only doing 30 clockwise, and 30 counterclockwise pendulums. Patient to return to playing competitive basketball at  [employer] [place].    Limitations Lifting;Reading;Walking;Writing;House hold activities   How long can you sit comfortably? n/a   How long can you stand comfortably? n/a   How long can you walk comfortably? 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 No/denies     Standing AROM: L flexion:   169, (slight thoracic hyperextension for compensation) abduction: 179 , ER: 55 ( or T3), IR T10,  Supine AROM: flexion 160, abduction: 167, ER 66, IR, full      Right Left  Shoulder Flexion 5/5 4/5  Shoulder Abduction 5/5 4+/5  ER 5/5 4/5  IR 5/5 4/5     Matrix machine: 16x deadlift modified with altering hand placements 17.5 lb 10overhand bar row 17.5 lb 10 underhand bar rows 17.5 lb  12x Modified standing bench (not passing past shoulder(no extension) 7.5lb  12x tricep rope pull down 22.5lb with bilateral arms 12x tricep single arm pulldown 12.5lb  12 modified pull down in abduction with 8 second eccentric raise 7.5lb    Quadruped walking on Treadmill for sta1bility (arms on treadmill) .0.5-0.7 m/s 1 minute Modified dip on floor to prevent excessive GH movement. 3 high.    QuickDash: work: 6.25%   Sport: 12.5%                    PT Education - 11/12/16 1535    Education  provided Yes   Education Details progression of functional strengthening   Person(s) Educated Patient   Methods Explanation;Demonstration;Verbal cues   Comprehension Verbalized understanding;Returned demonstration          PT Short Term Goals - 11/12/16 1543      PT SHORT TERM GOAL #1   Title Patient will demonstrate LUE strength of 4+/5 to return to competative collegiate basketball    Baseline 9/12: 4/5   Time 2   Period Weeks   Status New           PT Long Term Goals - 11/12/16 1536      PT LONG TERM GOAL #1   Title Patient will improve  shoulder ROM to > 140 degrees of flexion, scaption, and abduction for improved ability to perform overhead activities.   Baseline 9/12: flex 169, abd 179 8/8:  Flexion: arom=125, abduction arom=131; 7/9pt limited to 90 flexion and abduction per protocol at this time   Time 6   Period Weeks   Status Achieved     PT LONG TERM GOAL #2   Title Patient will decrease Quick DASH score by > 8 (to 5%) points demonstrating reduced self-reported upper extremity disability.   Baseline 8/8: 0%; 7/9: 13.63,    Time 6   Period Weeks   Status Achieved     PT LONG TERM GOAL #3   Title Patient will demonstrate LUE strength of 5/5 to return to competative collegiate basketball    Baseline 9/12: 4/5 8/8: L grossly 4-/5, R 5/5 7/9: will assess in 3 weeks   Time 6   Period Weeks   Status Revised     PT LONG TERM GOAL #4   Title Pt. LUE will be equal to RUE strength within 5 lbs allowing pt. to return to weight lifting regime.    Baseline 9/12: LUE limited > RUE in weight lifting by >5lb 8/7: LUE weaker than RUE; 7/9: unable to lift at this time   Time 6   Period Weeks   Status On-going     PT LONG TERM GOAL #5   Title Patient will decrease QuickDash sports module to <50% to allow for pt. to return to previous level of function   Baseline 9/12: 12.5 %8/8: 100%   Time 6   Period Weeks   Status Achieved     Additional Long Term Goals   Additional Long Term Goals Yes     PT LONG TERM GOAL #6   Title Patient will decrease QuickDash sports module to 0% to allow for pt. to return to previous level of function   Baseline 9/12: 12.5%   Time 6   Period Weeks   Status New     PT LONG TERM GOAL #7   Title Patient will return to playing basketball without limitations for return to previous level of function.    Baseline 9/12: unable to return to sport yet   Time 6   Period Weeks   Status New               Plan - 11/12/16 1548    Clinical Impression Statement Patient progressing towards  goals at this time. AROM is functional with flexion: 169, abd 179, ER: 55, IR T10 in standing. QuickDash sport 12.5%, work 6.25%. MMT: LUE 4/5 grossly. Patient progressing with functional strengthening via machines such as cable matrix. Stability is progressing with challenges to overhead motion. Postural correction required frequently via verbal cueing. Patient will continue to benefit from  skilled physical therapy to improve shoulder mobility and strength for return to previous level of function.    Rehab Potential Excellent   Clinical Impairments Affecting Rehab Potential  (-) s/p surgery, adherance to wearing sling, (+) desire to return to sport, age   PT Frequency 2x / week   PT Duration 6 weeks   PT Treatment/Interventions ADLs/Self Care Home Management;Biofeedback;Cryotherapy;Electrical Stimulation;Iontophoresis /ml Dexamethasone;Moist Heat;Ultrasound;Therapeutic activities;Functional mobility training;Therapeutic exercise;Neuromuscular re-education;Patient/family education;Manual techniques;Compression bandaging;Scar mobilization;Passive range of motion;Dry needling;Energy conservation;Splinting;Taping;Visual/perceptual remediation/compensation   PT Next Visit Plan ball, body blade, matrix, progress strength   PT Home Exercise Plan see sheet   Consulted and Agree with Plan of Care Patient      Patient will benefit from skilled therapeutic intervention in order to improve the following deficits and impairments:  Decreased knowledge of precautions, Decreased mobility, Decreased range of motion, Decreased safety awareness, Decreased strength, Decreased scar mobility, Hypomobility, Impaired UE functional use, Improper body mechanics, Postural dysfunction  Visit Diagnosis: Stiffness of left shoulder, not elsewhere classified  Muscle weakness (generalized)  Abnormal posture     Problem List Patient Active Problem List   Diagnosis Date Noted  . History of arthroscopy of left shoulder  09/08/2016   Precious Bard, PT, DPT   Precious Bard 11/12/2016, 3:49 PM  [place] [facility] MAIN [facility] SERVICES [address] [place], [place], [address] Phone: [phone]   Fax:  [phone]  Name: JAMARIE MUSSA MRN: [id number] Date of Birth: Sep 11, 1995

## 2016-11-13 ENCOUNTER — Ambulatory Visit: Payer: 59

## 2016-11-13 DIAGNOSIS — M6281 Muscle weakness (generalized): Secondary | ICD-10-CM

## 2016-11-13 DIAGNOSIS — M25612 Stiffness of left shoulder, not elsewhere classified: Secondary | ICD-10-CM | POA: Diagnosis not present

## 2016-11-13 DIAGNOSIS — R293 Abnormal posture: Secondary | ICD-10-CM

## 2016-11-13 NOTE — Therapy (Signed)
[place] [facility] MAIN [facility] SERVICES [address] [place], [place], [address] Phone: [phone]   Fax:  [phone]  Physical Therapy Treatment  Patient Details  Name: William Humphrey MRN: [id number] Date of Birth: Jun 16, 1995 Referring Provider: Jene Every, MD  Encounter Date: 11/13/2016      PT End of Session - 11/13/16 1741    Visit Number 20   Number of Visits 34   Date for PT Re-Evaluation 12/24/16   PT Start Time 1645   PT Stop Time 1730   PT Time Calculation (min) 45 min   Activity Tolerance Patient tolerated treatment well   Behavior During Therapy [facility] for tasks assessed/performed      History reviewed. No pertinent past medical history.  Past Surgical History:  Procedure Laterality Date  . SHOULDER ARTHROSCOPY WITH LABRAL REPAIR Left 08/05/2016   Procedure: LEFT SHOULDER ARTHROSCOPY WITH LABRAL REPAIR;  Surgeon: Cammy Copa, MD;  Location: [facility] OR;  Service: Orthopedics;  Laterality: Left;  . TOOTH EXTRACTION  2008    There were no vitals filed for this visit.      Subjective Assessment - 11/13/16 1741    Subjective Patient feeling rested, no soreness.    Pertinent History Pt. 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 Prior to surgery pt. sustained anterior dislocation a few years ago and had symptomatic instability since. Pt. had tera of anterior inferior labrum with tear of posterior labrum extending from the 1 oclock position to the 5 oclock position in the left glenoid with intact rotator cuff. Orders request PROM below shoulder level with ER <30, Abd <90, FF <90 and isometrics of RC and deltoid for three weeks. Pt. Is currently only doing 30 clockwise, and 30 counterclockwise pendulums. 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    Limitations Lifting;Reading;Walking;Writing;House hold activities   How long can you sit comfortably? n/a   How long can you stand comfortably? n/a   How long can you walk comfortably? 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 No/denies      TherEx Circuit 1 2x:1.  up up down down plank between two steps, 2. 8 bounces/dripples + pass x 10, 3. Drive with 5lb plate (PT cue for stop go (press) left, right (turn).   Circuit 2: 3x:. 1. TRX rows 12x, 2. Quadruped into pushup with UE reach on disc, 16x, 3..body blade overhead 30 seconds  Circuit 3:3x TRX chest/row abduction 12x, 2. Plank on swiss ball 1 minute, 3. Standing ER IR no weight AROM 12x  Circuit 4: 2x Woodchop D1 pattern 3.5 lb on cable machine 10x, 2. woodchop D2 pattern 3.5lb on cable machine 10, abduction to 90 with body blade 10x  Patient required occasional verbal cues for alignment of LUE due to occasional tendency to hike L shoulder when fatigued.     Patient progressing with functional strengthening and ROM interventions. Patient required cues for alignment of LUE due to occasional tendency to hike L shoulder when fatigued. Patient will continue to benefit from skilled physical therapy to improve shoulder mobility and strength for return to previous level of function.  PT Education - 11/13/16 1741    Education provided Yes   Education Details progression of body mechanics for strength   Person(s) Educated Patient   Methods Explanation;Demonstration;Verbal cues   Comprehension Verbalized understanding;Returned demonstration          PT Short Term Goals - 11/12/16 1543      PT SHORT TERM GOAL #1   Title Patient will demonstrate LUE strength of 4+/5 to return to competative collegiate basketball    Baseline 9/12: 4/5   Time 2   Period Weeks   Status New            PT Long Term Goals - 11/12/16 1536      PT LONG TERM GOAL #1   Title Patient will improve shoulder ROM to > 140 degrees of flexion, scaption, and abduction for improved ability to perform overhead activities.   Baseline 9/12: flex 169, abd 179 8/8:  Flexion: arom=125, abduction arom=131; 7/9pt limited to 90 flexion and abduction per protocol at this time   Time 6   Period Weeks   Status Achieved     PT LONG TERM GOAL #2   Title Patient will decrease Quick DASH score by > 8 (to 5%) points demonstrating reduced self-reported upper extremity disability.   Baseline 8/8: 0%; 7/9: 13.63,    Time 6   Period Weeks   Status Achieved     PT LONG TERM GOAL #3   Title Patient will demonstrate LUE strength of 5/5 to return to competative collegiate basketball    Baseline 9/12: 4/5 8/8: L grossly 4-/5, R 5/5 7/9: will assess in 3 weeks   Time 6   Period Weeks   Status Revised     PT LONG TERM GOAL #4   Title Pt. LUE will be equal to RUE strength within 5 lbs allowing pt. to return to weight lifting regime.    Baseline 9/12: LUE limited > RUE in weight lifting by >5lb 8/7: LUE weaker than RUE; 7/9: unable to lift at this time   Time 6   Period Weeks   Status On-going     PT LONG TERM GOAL #5   Title Patient will decrease QuickDash sports module to <50% to allow for pt. to return to previous level of function   Baseline 9/12: 12.5 %8/8: 100%   Time 6   Period Weeks   Status Achieved     Additional Long Term Goals   Additional Long Term Goals Yes     PT LONG TERM GOAL #6   Title Patient will decrease QuickDash sports module to 0% to allow for pt. to return to previous level of function   Baseline 9/12: 12.5%   Time 6   Period Weeks   Status New     PT LONG TERM GOAL #7   Title Patient will return to playing basketball without limitations for return to previous level of function.    Baseline 9/12: unable to return to sport yet   Time 6   Period Weeks   Status New                Plan - 11/13/16 1743    Clinical Impression Statement  Patient progressing with functional strengthening and ROM interventions. Patient required cues for alignment of LUE due to occasional tendency to hike L shoulder when fatigued. Patient will continue to benefit from skilled physical therapy to improve shoulder mobility and strength for return to previous level of function.  Rehab Potential Excellent   Clinical Impairments Affecting Rehab Potential  (-) s/p surgery, adherance to wearing sling, (+) desire to return to sport, age   PT Frequency 2x / week   PT Duration 6 weeks   PT Treatment/Interventions ADLs/Self Care Home Management;Biofeedback;Cryotherapy;Electrical Stimulation;Iontophoresis 4mg /ml Dexamethasone;Moist Heat;Ultrasound;Therapeutic activities;Functional mobility training;Therapeutic exercise;Neuromuscular re-education;Patient/family education;Manual techniques;Compression bandaging;Scar mobilization;Passive range of motion;Dry needling;Energy conservation;Splinting;Taping;Visual/perceptual remediation/compensation   PT Next Visit Plan ball, body blade, matrix, progress strength   PT Home Exercise Plan see sheet   Consulted and Agree with Plan of Care Patient      Patient will benefit from skilled therapeutic intervention in order to improve the following deficits and impairments:  Decreased knowledge of precautions, Decreased mobility, Decreased range of motion, Decreased safety awareness, Decreased strength, Decreased scar mobility, Hypomobility, Impaired UE functional use, Improper body mechanics, Postural dysfunction  Visit Diagnosis: Stiffness of left shoulder, not elsewhere classified  Muscle weakness (generalized)  Abnormal posture     Problem List Patient Active Problem List   Diagnosis Date Noted  . History of arthroscopy of left shoulder 09/08/2016  Precious BardMarina Dustine Bertini, PT, DPT    Precious BardMarina Newell Wafer 11/13/2016, 5:43 PM  [place] [facility] [address] [place], [place], [address] Phone: [phone]   Fax:  [phone]  Name: William Humphrey MRN: [id number] Date of Birth: 01/05/1996

## 2016-11-20 ENCOUNTER — Ambulatory Visit: Payer: 59

## 2016-11-20 DIAGNOSIS — M25612 Stiffness of left shoulder, not elsewhere classified: Secondary | ICD-10-CM

## 2016-11-20 DIAGNOSIS — M6281 Muscle weakness (generalized): Secondary | ICD-10-CM

## 2016-11-20 DIAGNOSIS — R293 Abnormal posture: Secondary | ICD-10-CM

## 2016-11-20 NOTE — Therapy (Signed)
[facility] MAIN [facility] SERVICES [address] [place], [place], [address] Phone: [phone]   Fax:  [phone]  Physical Therapy Treatment  Patient Details  Name: William Humphrey MRN: [id number] Date of Birth: [birth year] Referring Provider: Jene Every, MD  Encounter Date: 11/20/2016      PT End of Session - 11/20/16 1738    Visit Number 21   Number of Visits 34   Date for PT Re-Evaluation 12/24/16   PT Start Time 1649   PT Stop Time 1730   PT Time Calculation (min) 41 min   Activity Tolerance Patient tolerated treatment well   Behavior During Therapy [facility] for tasks assessed/performed      History reviewed. No pertinent past medical history.  Past Surgical History:  Procedure Laterality Date  . SHOULDER ARTHROSCOPY WITH LABRAL REPAIR Left 08/05/2016   Procedure: LEFT SHOULDER ARTHROSCOPY WITH LABRAL REPAIR;  Surgeon: Cammy Copa, MD;  Location: [facility] OR;  Service: Orthopedics;  Laterality: Left;  . TOOTH EXTRACTION  2008    There were no vitals filed for this visit.      Subjective Assessment - 11/20/16 1737    Subjective Patient continues to perform HEP at home, integrated into gym workout at this time.    Pertinent History Pt. 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 Prior to surgery pt. sustained anterior dislocation a few years ago and had symptomatic instability since. Pt. had tera of anterior inferior labrum with tear of posterior labrum extending from the 1 oclock position to the 5 oclock position in the left glenoid with intact rotator cuff. Orders request PROM below shoulder level with ER <30, Abd <90, FF <90 and isometrics of RC and deltoid for three weeks. Pt. Is currently only doing 30 clockwise, and 30 counterclockwise pendulums. 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    Limitations Lifting;Reading;Walking;Writing;House hold activities   How long can you sit comfortably? n/a   How long can you stand comfortably? n/a   How long can you walk comfortably? 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 No/denies     TherEx  Circuit 1: 3x [place] D1 pattern 3.5 lb on cable machine 10x, 2. woodchop D2 pattern 4.5lb on cable machine 10,  Plank hand walk on treadmill 1 minute .5 mps   Circuit 2: 2x:1.  up up down down plank between two steps, 2. , 2.. Up middle down plank on half bosu with PT directing which position to hold, 3.. Body blade abduction 10x    Circuit 3: 3x:. 1. TRX rows 12x, 2. Quadruped into pushup with UE reach on disc, 16x, 3..body blade overhead 30 seconds   Robber stretch over half foam roller 1 minute x 2.      Patient required occasional verbal cues for alignment of LUE due to occasional tendency to hike L shoulder when fatigued.                                PT Education - 11/20/16 1738    Education provided Yes   Education Details utilization of matrix/cable machines for strength, TRX   Person(s) Educated Patient  Methods Explanation;Demonstration;Verbal cues   Comprehension Verbalized understanding;Returned demonstration          PT Short Term Goals - 11/12/16 1543      PT SHORT TERM GOAL #1   Title Patient will demonstrate LUE strength of 4+/5 to return to competative collegiate basketball    Baseline 9/12: 4/5   Time 2   Period Weeks   Status New           PT Long Term Goals - 11/12/16 1536      PT LONG TERM GOAL #1   Title Patient will improve shoulder ROM to > 140 degrees of flexion, scaption, and abduction for improved ability to perform overhead activities.   Baseline 9/12: flex 169, abd 179 8/8:  Flexion: arom=125,  abduction arom=131; 7/9pt limited to 90 flexion and abduction per protocol at this time   Time 6   Period Weeks   Status Achieved     PT LONG TERM GOAL #2   Title Patient will decrease Quick DASH score by > 8 (to 5%) points demonstrating reduced self-reported upper extremity disability.   Baseline 8/8: 0%; 7/9: 13.63,    Time 6   Period Weeks   Status Achieved     PT LONG TERM GOAL #3   Title Patient will demonstrate LUE strength of 5/5 to return to competative collegiate basketball    Baseline 9/12: 4/5 8/8: L grossly 4-/5, R 5/5 7/9: will assess in 3 weeks   Time 6   Period Weeks   Status Revised     PT LONG TERM GOAL #4   Title Pt. LUE will be equal to RUE strength within 5 lbs allowing pt. to return to weight lifting regime.    Baseline 9/12: LUE limited > RUE in weight lifting by >5lb 8/7: LUE weaker than RUE; 7/9: unable to lift at this time   Time 6   Period Weeks   Status On-going     PT LONG TERM GOAL #5   Title Patient will decrease QuickDash sports module to <50% to allow for pt. to return to previous level of function   Baseline 9/12: 12.5 %8/8: 100%   Time 6   Period Weeks   Status Achieved     Additional Long Term Goals   Additional Long Term Goals Yes     PT LONG TERM GOAL #6   Title Patient will decrease QuickDash sports module to 0% to allow for pt. to return to previous level of function   Baseline 9/12: 12.5%   Time 6   Period Weeks   Status New     PT LONG TERM GOAL #7   Title Patient will return to playing basketball without limitations for return to previous level of function.    Baseline 9/12: unable to return to sport yet   Time 6   Period Weeks   Status New               Plan - 11/20/16 1738    Clinical Impression Statement Pt.'s functional capacity for movements has improved, performing increased duration and intensity of interventions. ER and abduction continue to be weak planes of motion for patient. Continued utilization of  matrix/cable machines will be performed. Patient will continue to benefit from skilled physical therapy to improve shoulder mobility and strength for return to previous level of function.    Rehab Potential Excellent   Clinical Impairments Affecting Rehab Potential  (-) s/p surgery, adherance to wearing sling, (+) desire  to return to sport, age   PT Frequency 2x / week   PT Duration 6 weeks   PT Treatment/Interventions ADLs/Self Care Home Management;Biofeedback;Cryotherapy;Electrical Stimulation;Iontophoresis /ml Dexamethasone;Moist Heat;Ultrasound;Therapeutic activities;Functional mobility training;Therapeutic exercise;Neuromuscular re-education;Patient/family education;Manual techniques;Compression bandaging;Scar mobilization;Passive range of motion;Dry needling;Energy conservation;Splinting;Taping;Visual/perceptual remediation/compensation   PT Next Visit Plan ball, body blade, matrix, progress strength   PT Home Exercise Plan see sheet   Consulted and Agree with Plan of Care Patient      Patient will benefit from skilled therapeutic intervention in order to improve the following deficits and impairments:  Decreased knowledge of precautions, Decreased mobility, Decreased range of motion, Decreased safety awareness, Decreased strength, Decreased scar mobility, Hypomobility, Impaired UE functional use, Improper body mechanics, Postural dysfunction  Visit Diagnosis: Stiffness of left shoulder, not elsewhere classified  Muscle weakness (generalized)  Abnormal posture     Problem List Patient Active Problem List   Diagnosis Date Noted  . History of arthroscopy of left shoulder 09/08/2016   Precious Bard, PT, DPT   Precious Bard 11/20/2016, 5:40 PM  [facility] MAIN [facility] SERVICES [address] [place], [place], [address] Phone: [phone]   Fax:  [phone]  Name: William Humphrey MRN: [id number] Date of Birth: 03/23/1995

## 2016-11-27 ENCOUNTER — Ambulatory Visit: Payer: 59

## 2016-12-02 ENCOUNTER — Ambulatory Visit: Payer: 59 | Attending: Specialist

## 2016-12-02 DIAGNOSIS — M25612 Stiffness of left shoulder, not elsewhere classified: Secondary | ICD-10-CM | POA: Insufficient documentation

## 2016-12-02 DIAGNOSIS — R293 Abnormal posture: Secondary | ICD-10-CM | POA: Insufficient documentation

## 2016-12-02 DIAGNOSIS — M6281 Muscle weakness (generalized): Secondary | ICD-10-CM | POA: Insufficient documentation

## 2016-12-02 NOTE — Therapy (Signed)
[facility] MAIN [facility] SERVICES [address] [place], [place], [address] Phone: [phone]   Fax:  [phone]  Physical Therapy Treatment  Patient Details  Name: William Humphrey MRN: [id number] Date of Birth: 1995/03/13 Referring Provider: Jene Every, MD  Encounter Date: 12/02/2016      PT End of Session - 12/02/16 1753    Visit Number 22   Number of Visits 34   Date for PT Re-Evaluation 12/24/16   PT Start Time 1645   PT Stop Time 1731   PT Time Calculation (min) 46 min   Activity Tolerance Patient tolerated treatment well   Behavior During Therapy [facility] for tasks assessed/performed      History reviewed. No pertinent past medical history.  Past Surgical History:  Procedure Laterality Date  . SHOULDER ARTHROSCOPY WITH LABRAL REPAIR Left 08/05/2016   Procedure: LEFT SHOULDER ARTHROSCOPY WITH LABRAL REPAIR;  Surgeon: Cammy Copa, MD;  Location: [facility] OR;  Service: Orthopedics;  Laterality: Left;  . TOOTH EXTRACTION  2008    There were no vitals filed for this visit.      Subjective Assessment - 12/02/16 1752    Subjective Patient missed last session due to death in the family. Was compliant with HEP when away for his family.    Pertinent History Pt. 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 Prior to surgery pt. sustained anterior dislocation a few years ago and had symptomatic instability since. Pt. had tera of anterior inferior labrum with tear of posterior labrum extending from the 1 oclock position to the 5 oclock position in the left glenoid with intact rotator cuff. Orders request PROM below shoulder level with ER <30, Abd <90, FF <90 and isometrics of RC and deltoid for three weeks. Pt. Is currently only doing 30 clockwise, and 30 counterclockwise pendulums. 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    Limitations Lifting;Reading;Walking;Writing;House hold activities   How long can you sit comfortably? n/a   How long can you stand comfortably? n/a   How long can you walk comfortably? 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 No/denies      Circuit 1 3x: from standing walk out into plank, push up, walk back up to standing 10x, body blade abduction with 3 second overhead 10x, overhead circles 10x clockwise 10x counterclockwise  Circuit 2: 2x: upright bent rows 20lb tactile cueing between shoulder blades 10-12x, 21s with 5lb dumbbell, prone thoracic and shoulder rotation via PVC pipe to open chest 5x each side  Circuit 3: supine weighted ball flexion to ~130 degrees hold 10 seconds return to flexion 10x, TRX rows 15x, plank UE flexion via discs 10x each arm  Patient required frequent verbal and tactile cueing for upright posture, tactile cueing between shoulder blades for proper row mechanics  Postural awareness and education.   Pt. response to medical necessity:  Patient will continue to benefit from skilled physical therapy to improve shoulder mobility and strength for return to previous level of function.                           PT Education - 12/02/16 1753    Education provided Yes  Education Details body mechanics with increasing resistance, pectoral and shoulder stretching   Person(s) Educated Patient   Methods Explanation;Demonstration;Verbal cues   Comprehension Verbalized understanding;Returned demonstration          PT Short Term Goals - 11/12/16 1543      PT SHORT TERM GOAL #1   Title Patient will demonstrate LUE strength of 4+/5 to return to competative collegiate basketball    Baseline 9/12: 4/5   Time 2   Period Weeks   Status New            PT Long Term Goals - 11/12/16 1536      PT LONG TERM GOAL #1   Title Patient will improve shoulder ROM to > 140 degrees of flexion, scaption, and abduction for improved ability to perform overhead activities.   Baseline 9/12: flex 169, abd 179 8/8:  Flexion: arom=125, abduction arom=131; 7/9pt limited to 90 flexion and abduction per protocol at this time   Time 6   Period Weeks   Status Achieved     PT LONG TERM GOAL #2   Title Patient will decrease Quick DASH score by > 8 (to 5%) points demonstrating reduced self-reported upper extremity disability.   Baseline 8/8: 0%; 7/9: 13.63,    Time 6   Period Weeks   Status Achieved     PT LONG TERM GOAL #3   Title Patient will demonstrate LUE strength of 5/5 to return to competative collegiate basketball    Baseline 9/12: 4/5 8/8: L grossly 4-/5, R 5/5 7/9: will assess in 3 weeks   Time 6   Period Weeks   Status Revised     PT LONG TERM GOAL #4   Title Pt. LUE will be equal to RUE strength within 5 lbs allowing pt. to return to weight lifting regime.    Baseline 9/12: LUE limited > RUE in weight lifting by >5lb 8/7: LUE weaker than RUE; 7/9: unable to lift at this time   Time 6   Period Weeks   Status On-going     PT LONG TERM GOAL #5   Title Patient will decrease QuickDash sports module to <50% to allow for pt. to return to previous level of function   Baseline 9/12: 12.5 %8/8: 100%   Time 6   Period Weeks   Status Achieved     Additional Long Term Goals   Additional Long Term Goals Yes     PT LONG TERM GOAL #6   Title Patient will decrease QuickDash sports module to 0% to allow for pt. to return to previous level of function   Baseline 9/12: 12.5%   Time 6   Period Weeks   Status New     PT LONG TERM GOAL #7   Title Patient will return to playing basketball without limitations for return to previous level of function.    Baseline 9/12: unable to return to sport yet   Time 6   Period Weeks   Status New                Plan - 12/02/16 1756    Clinical Impression Statement Patient progressing with functional strength and capacity for overhead motions at this time. Postural rounded shoulders increased since last session due to length of time between sessions, patient educated on importance of postural awareness for proper healing of shoulder. Patient will continue to benefit from skilled physical therapy to improve shoulder mobility and strength for return to previous level of function.  Rehab Potential Excellent   Clinical Impairments Affecting Rehab Potential  (-) s/p surgery, adherance to wearing sling, (+) desire to return to sport, age   PT Frequency 2x / week   PT Duration 6 weeks   PT Treatment/Interventions ADLs/Self Care Home Management;Biofeedback;Cryotherapy;Electrical Stimulation;Iontophoresis /ml Dexamethasone;Moist Heat;Ultrasound;Therapeutic activities;Functional mobility training;Therapeutic exercise;Neuromuscular re-education;Patient/family education;Manual techniques;Compression bandaging;Scar mobilization;Passive range of motion;Dry needling;Energy conservation;Splinting;Taping;Visual/perceptual remediation/compensation   PT Next Visit Plan ball, body blade, matrix, progress strength   PT Home Exercise Plan see sheet   Consulted and Agree with Plan of Care Patient      Patient will benefit from skilled therapeutic intervention in order to improve the following deficits and impairments:  Decreased knowledge of precautions, Decreased mobility, Decreased range of motion, Decreased safety awareness, Decreased strength, Decreased scar mobility, Hypomobility, Impaired UE functional use, Improper body mechanics, Postural dysfunction  Visit Diagnosis: Stiffness of left shoulder, not elsewhere classified  Muscle weakness (generalized)  Abnormal posture     Problem List Patient Active Problem List   Diagnosis Date Noted  . History of arthroscopy of left shoulder  09/08/2016   Precious Bard, PT, DPT   Precious Bard 12/02/2016, 5:56 PM  [facility] MAIN [facility] SERVICES [address] [place], [place], [address] Phone: [phone]   Fax:  [phone]  Name: BRENDA COWHER MRN: [id number] Date of Birth: 1995-10-19

## 2016-12-04 ENCOUNTER — Ambulatory Visit: Payer: 59

## 2016-12-04 DIAGNOSIS — R293 Abnormal posture: Secondary | ICD-10-CM

## 2016-12-04 DIAGNOSIS — M6281 Muscle weakness (generalized): Secondary | ICD-10-CM

## 2016-12-04 DIAGNOSIS — M25612 Stiffness of left shoulder, not elsewhere classified: Secondary | ICD-10-CM | POA: Diagnosis not present

## 2016-12-04 NOTE — Therapy (Signed)
[facility] MAIN [facility] SERVICES [address] [place], [place], [address] Phone: [phone]   Fax:  [phone]  Physical Therapy Treatment  Patient Details  Name: William Humphrey MRN: [id number] Date of Birth: [birth year] Referring Provider: Jene Every, MD  Encounter Date: 12/04/2016      PT End of Session - 12/04/16 1644    Visit Number 23   Number of Visits 34   Date for PT Re-Evaluation 12/24/16   PT Start Time 1645   PT Stop Time 1730   PT Time Calculation (min) 45 min   Activity Tolerance Patient tolerated treatment well   Behavior During Therapy [facility] for tasks assessed/performed      History reviewed. No pertinent past medical history.  Past Surgical History:  Procedure Laterality Date  . SHOULDER ARTHROSCOPY WITH LABRAL REPAIR Left 08/05/2016   Procedure: LEFT SHOULDER ARTHROSCOPY WITH LABRAL REPAIR;  Surgeon: Cammy Copa, MD;  Location: [facility] OR;  Service: Orthopedics;  Laterality: Left;  . TOOTH EXTRACTION  2008    There were no vitals filed for this visit.          Subjective Assessment - 12/05/16 0756    Subjective Patient reports no complaints, no pain. Compliant with HEP, plans to practice with [employer] over weekend.    Pertinent History Pt. 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 Prior to surgery pt. sustained anterior dislocation a few years ago and had symptomatic instability since. Pt. had tera of anterior inferior labrum with tear of posterior labrum extending from the 1 oclock position to the 5 oclock position in the left glenoid with intact rotator cuff. Orders request PROM below shoulder level with ER <30, Abd <90, FF <90 and isometrics of RC and deltoid for three weeks. Pt. Is currently only doing 30 clockwise, and 30 counterclockwise pendulums. 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    Limitations Lifting;Reading;Walking;Writing;House hold activities   How long can you sit comfortably? n/a   How long can you stand comfortably? n/a   How long can you walk comfortably? 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 No/denies            Circuit 1: 2x, : 1. Up up down down plank onto two green steps 60 seconds total, 2. Row machine plate 5, 3. Cross body body blade 60 seconds   Circuit 2: 2x: 1. Straight arm overhead pullover with 10lb dumbbells in supine 8-12, 2, wide grip grow machine plate 4, 3. Overhead body blade 1 minute neutral one minute pronated  Circuit 3: 2x, 1. Pike plank walk ups TRX 5x, 2. Plank arm abduction on red sliding discs 8-10, 3. Child pose stretch  Circuit 4 on half foam roller: 2x: 1. Robber stretch, 2. Abduction with blue band 10x, 3. ER with blue band 8-10  Manual:  PROM flexion with scapular hold Distraction of LUE in multiple planes  Rhythmic pertubation                       PT Education - 12/04/16 1643    Education provided Yes   Education Details body mechanics with functional strengthening.    Person(s) Educated Patient   Methods  Explanation;Demonstration;Verbal cues   Comprehension Returned demonstration;Verbalized understanding          PT Short Term Goals - 11/12/16 1543      PT SHORT TERM GOAL #1   Title Patient will demonstrate LUE strength of 4+/5 to return to competative collegiate basketball    Baseline 9/12: 4/5   Time 2   Period Weeks   Status New           PT Long Term Goals - 11/12/16 1536      PT LONG TERM GOAL #1   Title Patient will improve shoulder ROM to > 140 degrees of flexion, scaption, and abduction for improved ability to perform overhead activities.   Baseline 9/12: flex 169, abd 179  8/8:  Flexion: arom=125, abduction arom=131; 7/9pt limited to 90 flexion and abduction per protocol at this time   Time 6   Period Weeks   Status Achieved     PT LONG TERM GOAL #2   Title Patient will decrease Quick DASH score by > 8 (to 5%) points demonstrating reduced self-reported upper extremity disability.   Baseline 8/8: 0%; 7/9: 13.63,    Time 6   Period Weeks   Status Achieved     PT LONG TERM GOAL #3   Title Patient will demonstrate LUE strength of 5/5 to return to competative collegiate basketball    Baseline 9/12: 4/5 8/8: L grossly 4-/5, R 5/5 7/9: will assess in 3 weeks   Time 6   Period Weeks   Status Revised     PT LONG TERM GOAL #4   Title Pt. LUE will be equal to RUE strength within 5 lbs allowing pt. to return to weight lifting regime.    Baseline 9/12: LUE limited > RUE in weight lifting by >5lb 8/7: LUE weaker than RUE; 7/9: unable to lift at this time   Time 6   Period Weeks   Status On-going     PT LONG TERM GOAL #5   Title Patient will decrease QuickDash sports module to <50% to allow for pt. to return to previous level of function   Baseline 9/12: 12.5 %8/8: 100%   Time 6   Period Weeks   Status Achieved     Additional Long Term Goals   Additional Long Term Goals Yes     PT LONG TERM GOAL #6   Title Patient will decrease QuickDash sports module to 0% to allow for pt. to return to previous level of function   Baseline 9/12: 12.5%   Time 6   Period Weeks   Status New     PT LONG TERM GOAL #7   Title Patient will return to playing basketball without limitations for return to previous level of function.    Baseline 9/12: unable to return to sport yet   Time 6   Period Weeks   Status New               Plan - 12/05/16 0759    Clinical Impression Statement Dynamic stability interventions progressing in difficulty with decreased compensatory pattern of recruitment. L shoulder continues to be weaker than R, however is slowly decreasing  difference. Patient will continue to benefit from skilled physical therapy to allow patient to return to previous level of function.    Rehab Potential Excellent   Clinical Impairments Affecting Rehab Potential  (-) s/p surgery, adherance to wearing sling, (+) desire to return to sport, age   PT Frequency 2x / week  PT Duration 6 weeks   PT Treatment/Interventions ADLs/Self Care Home Management;Biofeedback;Cryotherapy;Electrical Stimulation;Iontophoresis /ml Dexamethasone;Moist Heat;Ultrasound;Therapeutic activities;Functional mobility training;Therapeutic exercise;Neuromuscular re-education;Patient/family education;Manual techniques;Compression bandaging;Scar mobilization;Passive range of motion;Dry needling;Energy conservation;Splinting;Taping;Visual/perceptual remediation/compensation   PT Next Visit Plan ball, body blade, matrix, progress strength   PT Home Exercise Plan see sheet   Consulted and Agree with Plan of Care Patient      Patient will benefit from skilled therapeutic intervention in order to improve the following deficits and impairments:  Decreased knowledge of precautions, Decreased mobility, Decreased range of motion, Decreased safety awareness, Decreased strength, Decreased scar mobility, Hypomobility, Impaired UE functional use, Improper body mechanics, Postural dysfunction  Visit Diagnosis: Stiffness of left shoulder, not elsewhere classified  Muscle weakness (generalized)  Abnormal posture     Problem List Patient Active Problem List   Diagnosis Date Noted  . History of arthroscopy of left shoulder 09/08/2016  Precious Bard, PT, DPT    Precious Bard 12/05/2016, 8:00 AM  [place] [facility] MAIN [facility] SERVICES [address] [place], [place], [address] Phone: [phone]   Fax:  [phone]  Name: William Humphrey MRN: [id number] Date of Birth: [birth year]

## 2016-12-09 ENCOUNTER — Ambulatory Visit: Payer: 59

## 2016-12-09 DIAGNOSIS — M6281 Muscle weakness (generalized): Secondary | ICD-10-CM | POA: Diagnosis not present

## 2016-12-09 DIAGNOSIS — M25612 Stiffness of left shoulder, not elsewhere classified: Secondary | ICD-10-CM

## 2016-12-09 DIAGNOSIS — R293 Abnormal posture: Secondary | ICD-10-CM | POA: Diagnosis not present

## 2016-12-10 NOTE — Therapy (Signed)
[facility] MAIN [facility] SERVICES [address] [place], [place], [address] Phone: [phone]   Fax:  [phone]  Physical Therapy Treatment  Patient Details  Name: William Humphrey MRN: [id number] Date of Birth: 1995-11-30 Referring Provider: Jene Every, MD  Encounter Date: 12/09/2016      PT End of Session - 12/10/16 0803    Visit Number 24   Number of Visits 34   Date for PT Re-Evaluation 12/24/16   PT Start Time 1647   PT Stop Time 1733   PT Time Calculation (min) 46 min   Activity Tolerance Patient tolerated treatment well   Behavior During Therapy [facility] for tasks assessed/performed      History reviewed. No pertinent past medical history.  Past Surgical History:  Procedure Laterality Date  . SHOULDER ARTHROSCOPY WITH LABRAL REPAIR Left 08/05/2016   Procedure: LEFT SHOULDER ARTHROSCOPY WITH LABRAL REPAIR;  Surgeon: Cammy Copa, MD;  Location: [facility] OR;  Service: Orthopedics;  Laterality: Left;  . TOOTH EXTRACTION  2008    There were no vitals filed for this visit.      Subjective Assessment - 12/10/16 0759    Subjective Patient played basketball with no complaints/pain since last visit, Compliant with HEP   Pertinent History Pt. 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 Prior to surgery pt. sustained anterior dislocation a few years ago and had symptomatic instability since. Pt. had tera of anterior inferior labrum with tear of posterior labrum extending from the 1 oclock position to the 5 oclock position in the left glenoid with intact rotator cuff. Orders request PROM below shoulder level with ER <30, Abd <90, FF <90 and isometrics of RC and deltoid for three weeks. Pt. Is currently only doing 30 clockwise, and 30 counterclockwise pendulums. Patient to return to playing competitive basketball at  [employer] in [place] where he plays shooting guard.    Limitations Lifting;Reading;Walking;Writing;House hold activities   How long can you sit comfortably? n/a   How long can you stand comfortably? n/a   How long can you walk comfortably? 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 No/denies      Every circuit performed 2x, occasional tactile cueing for correct alignment  Circuit 1:1.  plank up up down down on green step 20x, 2. Row machine with 5 plates on 10U 3. Side plank 30 seconds each side   Circuit 2: 1. Face pulls 6 plates on cable machine 15x, 2. 8lb weighted ball out and ups 20x, 3. Overhead body blade 2x 60 seconds  Circuit 3: 1. Jimmye Norman carries 40lb. 200 ft, 2. bosu ball blue side up push ups hold at PT directed for PT directed length: up, middle, down, 3. Overhead UE raises with 10 second holds x 8 each arm  Robber stretch 60 seconds on half foam roller  Half foam roller AAROM stretch with PVC pipe 10x10 second holds                            PT Education - 12/10/16 0803    Education provided Yes   Education Details body mechanics for functional strength   Person(s) Educated Patient   Methods Explanation;Demonstration;Verbal cues   Comprehension Verbalized understanding;Returned demonstration  PT Short Term Goals - 11/12/16 1543      PT SHORT TERM GOAL #1   Title Patient will demonstrate LUE strength of 4+/5 to return to competative collegiate basketball    Baseline 9/12: 4/5   Time 2   Period Weeks   Status New           PT Long Term Goals - 11/12/16 1536      PT LONG TERM GOAL #1   Title Patient will improve shoulder ROM to > 140 degrees of flexion, scaption, and abduction for improved ability to perform overhead activities.   Baseline 9/12: flex 169, abd 179 8/8:  Flexion: arom=125, abduction arom=131; 7/9pt  limited to 90 flexion and abduction per protocol at this time   Time 6   Period Weeks   Status Achieved     PT LONG TERM GOAL #2   Title Patient will decrease Quick DASH score by > 8 (to 5%) points demonstrating reduced self-reported upper extremity disability.   Baseline 8/8: 0%; 7/9: 13.63,    Time 6   Period Weeks   Status Achieved     PT LONG TERM GOAL #3   Title Patient will demonstrate LUE strength of 5/5 to return to competative collegiate basketball    Baseline 9/12: 4/5 8/8: L grossly 4-/5, R 5/5 7/9: will assess in 3 weeks   Time 6   Period Weeks   Status Revised     PT LONG TERM GOAL #4   Title Pt. LUE will be equal to RUE strength within 5 lbs allowing pt. to return to weight lifting regime.    Baseline 9/12: LUE limited > RUE in weight lifting by >5lb 8/7: LUE weaker than RUE; 7/9: unable to lift at this time   Time 6   Period Weeks   Status On-going     PT LONG TERM GOAL #5   Title Patient will decrease QuickDash sports module to <50% to allow for pt. to return to previous level of function   Baseline 9/12: 12.5 %8/8: 100%   Time 6   Period Weeks   Status Achieved     Additional Long Term Goals   Additional Long Term Goals Yes     PT LONG TERM GOAL #6   Title Patient will decrease QuickDash sports module to 0% to allow for pt. to return to previous level of function   Baseline 9/12: 12.5%   Time 6   Period Weeks   Status New     PT LONG TERM GOAL #7   Title Patient will return to playing basketball without limitations for return to previous level of function.    Baseline 9/12: unable to return to sport yet   Time 6   Period Weeks   Status New               Plan - 12/10/16 1610    Clinical Impression Statement  Patient progressing with overhead dynamic stability. Decreased compensatory patterning of recruitment occurred with patient increasing weight/resistance. Patient will continue to benefit from skilled physical therapy to allow patient to  return to previous level of function.    Rehab Potential Excellent   Clinical Impairments Affecting Rehab Potential  (-) s/p surgery, adherance to wearing sling, (+) desire to return to sport, age   PT Frequency 2x / week   PT Duration 6 weeks   PT Treatment/Interventions ADLs/Self Care Home Management;Biofeedback;Cryotherapy;Electrical Stimulation;Iontophoresis /ml Dexamethasone;Moist Heat;Ultrasound;Therapeutic activities;Functional mobility training;Therapeutic exercise;Neuromuscular re-education;Patient/family education;Manual techniques;Compression bandaging;Scar  mobilization;Passive range of motion;Dry needling;Energy conservation;Splinting;Taping;Visual/perceptual remediation/compensation   PT Next Visit Plan ball, body blade, matrix, progress strength   PT Home Exercise Plan see sheet   Consulted and Agree with Plan of Care Patient      Patient will benefit from skilled therapeutic intervention in order to improve the following deficits and impairments:  Decreased knowledge of precautions, Decreased mobility, Decreased range of motion, Decreased safety awareness, Decreased strength, Decreased scar mobility, Hypomobility, Impaired UE functional use, Improper body mechanics, Postural dysfunction  Visit Diagnosis: Stiffness of left shoulder, not elsewhere classified  Muscle weakness (generalized)  Abnormal posture     Problem List Patient Active Problem List   Diagnosis Date Noted  . History of arthroscopy of left shoulder 09/08/2016  Precious Bard, PT, DPT    Precious Bard 12/10/2016, 8:08 AM  [place] [facility] MAIN [facility] SERVICES [address] [place], [place], [address] Phone: [phone]   Fax:  [phone]  Name: MAYNARD DAVID MRN: [id number] Date of Birth: [birth year]

## 2016-12-11 ENCOUNTER — Ambulatory Visit: Payer: 59

## 2016-12-11 DIAGNOSIS — M6281 Muscle weakness (generalized): Secondary | ICD-10-CM | POA: Diagnosis not present

## 2016-12-11 DIAGNOSIS — R293 Abnormal posture: Secondary | ICD-10-CM

## 2016-12-11 DIAGNOSIS — M25612 Stiffness of left shoulder, not elsewhere classified: Secondary | ICD-10-CM | POA: Diagnosis not present

## 2016-12-11 NOTE — Therapy (Signed)
[facility] MAIN [facility] SERVICES [address] [place], [place], [address] Phone: [phone]   Fax:  [phone]  Physical Therapy Treatment  Patient Details  Name: William Humphrey MRN: [id number] Date of Birth: November 29, 1995 Referring Provider: Jene Every, MD  Encounter Date: 12/11/2016      PT End of Session - 12/11/16 1606    Visit Number 25   Number of Visits 34   Date for PT Re-Evaluation 12/24/16   PT Start Time 1515   PT Stop Time 1600   PT Time Calculation (min) 45 min   Activity Tolerance Patient tolerated treatment well   Behavior During Therapy [facility] for tasks assessed/performed      History reviewed. No pertinent past medical history.  Past Surgical History:  Procedure Laterality Date  . SHOULDER ARTHROSCOPY WITH LABRAL REPAIR Left 08/05/2016   Procedure: LEFT SHOULDER ARTHROSCOPY WITH LABRAL REPAIR;  Surgeon: Cammy Copa, MD;  Location: [facility] OR;  Service: Orthopedics;  Laterality: Left;  . TOOTH EXTRACTION  2008    There were no vitals filed for this visit.      Subjective Assessment - 12/11/16 1605    Subjective Patient reports compliant with HEP, no complaints/pain. Continues to notice difference in strength between arms.    Pertinent History Pt. 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 Prior to surgery pt. sustained anterior dislocation a few years ago and had symptomatic instability since. Pt. had tera of anterior inferior labrum with tear of posterior labrum extending from the 1 oclock position to the 5 oclock position in the left glenoid with intact rotator cuff. Orders request PROM below shoulder level with ER <30, Abd <90, FF <90 and isometrics of RC and deltoid for three weeks. Pt. Is currently only doing 30 clockwise, and 30 counterclockwise pendulums. 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    Limitations Lifting;Reading;Walking;Writing;House hold activities   How long can you sit comfortably? n/a   How long can you stand comfortably? n/a   How long can you walk comfortably? 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 No/denies       Treadmill 0.10m/s 1 minutes    Circuit 1: 2x: 1. Lat pull down 4 plates 13K, 2. Plank with UE flexion utilizing discs 10x each side, 3. TRX flys 12x  Circuit 2:2x 1.  Tricep pushdown with pulley machine 13lb, single limb, increased pronation to activate internal musculature 15x, 2. Body blade flexion with 5 second overhead holds 10x, 3. Plank UE abduction on discs 8x each side  Circuit 3: 2x. 1. Bicep curl with pulley machine 13lb 12x, 2. Hammer curl with pulley machine 13lb 10x, overhead chest raises supine 10x.                       PT Education - 12/11/16 1606    Education provided Yes   Education Details functional strength body mechanics   Person(s) Educated Patient   Methods Explanation;Demonstration;Verbal cues   Comprehension Verbalized understanding;Returned demonstration          PT Short Term Goals - 11/12/16 1543      PT SHORT TERM GOAL #1   Title Patient will demonstrate LUE strength of  4+/5 to return to competative collegiate basketball    Baseline 9/12: 4/5   Time 2   Period Weeks   Status New           PT Long Term Goals - 11/12/16 1536      PT LONG TERM GOAL #1   Title Patient will improve shoulder ROM to > 140 degrees of flexion, scaption, and abduction for improved ability to perform overhead activities.   Baseline 9/12: flex 169, abd 179 8/8:  Flexion: arom=125, abduction arom=131; 7/9pt limited to 90 flexion and abduction per protocol at this time   Time 6   Period Weeks   Status Achieved     PT  LONG TERM GOAL #2   Title Patient will decrease Quick DASH score by > 8 (to 5%) points demonstrating reduced self-reported upper extremity disability.   Baseline 8/8: 0%; 7/9: 13.63,    Time 6   Period Weeks   Status Achieved     PT LONG TERM GOAL #3   Title Patient will demonstrate LUE strength of 5/5 to return to competative collegiate basketball    Baseline 9/12: 4/5 8/8: L grossly 4-/5, R 5/5 7/9: will assess in 3 weeks   Time 6   Period Weeks   Status Revised     PT LONG TERM GOAL #4   Title Pt. LUE will be equal to RUE strength within 5 lbs allowing pt. to return to weight lifting regime.    Baseline 9/12: LUE limited > RUE in weight lifting by >5lb 8/7: LUE weaker than RUE; 7/9: unable to lift at this time   Time 6   Period Weeks   Status On-going     PT LONG TERM GOAL #5   Title Patient will decrease QuickDash sports module to <50% to allow for pt. to return to previous level of function   Baseline 9/12: 12.5 %8/8: 100%   Time 6   Period Weeks   Status Achieved     Additional Long Term Goals   Additional Long Term Goals Yes     PT LONG TERM GOAL #6   Title Patient will decrease QuickDash sports module to 0% to allow for pt. to return to previous level of function   Baseline 9/12: 12.5%   Time 6   Period Weeks   Status New     PT LONG TERM GOAL #7   Title Patient will return to playing basketball without limitations for return to previous level of function.    Baseline 9/12: unable to return to sport yet   Time 6   Period Weeks   Status New               Plan - 12/11/16 1611    Clinical Impression Statement Patient progressing with functional strength, increasing in weight and progression of body mechanics. Patient does not require as frequent tactile cueing for correct body mechanics. Patient will continue to benefit from skilled physical therapy to allow patient to return to previous level of function.    Rehab Potential Excellent   Clinical  Impairments Affecting Rehab Potential  (-) s/p surgery, adherance to wearing sling, (+) desire to return to sport, age   PT Frequency 2x / week   PT Duration 6 weeks   PT Treatment/Interventions ADLs/Self Care Home Management;Biofeedback;Cryotherapy;Electrical Stimulation;Iontophoresis /ml Dexamethasone;Moist Heat;Ultrasound;Therapeutic activities;Functional mobility training;Therapeutic exercise;Neuromuscular re-education;Patient/family education;Manual techniques;Compression bandaging;Scar mobilization;Passive range of motion;Dry needling;Energy conservation;Splinting;Taping;Visual/perceptual remediation/compensation   PT Next Visit Plan ball, body blade, matrix, progress  strength   PT Home Exercise Plan see sheet   Consulted and Agree with Plan of Care Patient      Patient will benefit from skilled therapeutic intervention in order to improve the following deficits and impairments:  Decreased knowledge of precautions, Decreased mobility, Decreased range of motion, Decreased safety awareness, Decreased strength, Decreased scar mobility, Hypomobility, Impaired UE functional use, Improper body mechanics, Postural dysfunction  Visit Diagnosis: Stiffness of left shoulder, not elsewhere classified  Muscle weakness (generalized)  Abnormal posture     Problem List Patient Active Problem List   Diagnosis Date Noted  . History of arthroscopy of left shoulder 09/08/2016  Precious Bard, PT, DPT    Precious Bard 12/11/2016, 4:53 PM  [place] [facility] MAIN [facility] SERVICES [address] [place], [place], [address] Phone: [phone]   Fax:  [phone]  Name: TRAY KLAYMAN MRN: [id number] Date of Birth: 1995/07/13

## 2016-12-16 ENCOUNTER — Ambulatory Visit: Payer: 59

## 2016-12-16 DIAGNOSIS — R293 Abnormal posture: Secondary | ICD-10-CM

## 2016-12-16 DIAGNOSIS — M25612 Stiffness of left shoulder, not elsewhere classified: Secondary | ICD-10-CM | POA: Diagnosis not present

## 2016-12-16 DIAGNOSIS — M6281 Muscle weakness (generalized): Secondary | ICD-10-CM | POA: Diagnosis not present

## 2016-12-16 NOTE — Therapy (Signed)
[facility] MAIN [facility] SERVICES [address] [place], [place], [address] Phone: [phone]   Fax:  [phone]  Physical Therapy Treatment  Patient Details  Name: William Humphrey MRN: [id number] Date of Birth: Mar 13, 1995 Referring Provider: Jene Every, MD  Encounter Date: 12/16/2016      PT End of Session - 12/16/16 1741    Visit Number 26   Number of Visits 34   Date for PT Re-Evaluation 12/24/16   PT Start Time 1654   PT Stop Time 1733   PT Time Calculation (min) 39 min   Activity Tolerance Patient tolerated treatment well   Behavior During Therapy [facility] for tasks assessed/performed      History reviewed. No pertinent past medical history.  Past Surgical History:  Procedure Laterality Date  . SHOULDER ARTHROSCOPY WITH LABRAL REPAIR Left 08/05/2016   Procedure: LEFT SHOULDER ARTHROSCOPY WITH LABRAL REPAIR;  Surgeon: Cammy Copa, MD;  Location: [facility] OR;  Service: Orthopedics;  Laterality: Left;  . TOOTH EXTRACTION  2008    There were no vitals filed for this visit.      Subjective Assessment - 12/16/16 1736    Subjective Patient compliant with HEP. Performs gym sessions between sessions. No complaints   Pertinent History Pt. 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 Prior to surgery pt. sustained anterior dislocation a few years ago and had symptomatic instability since. Pt. had tera of anterior inferior labrum with tear of posterior labrum extending from the 1 oclock position to the 5 oclock position in the left glenoid with intact rotator cuff. Orders request PROM below shoulder level with ER <30, Abd <90, FF <90 and isometrics of RC and deltoid for three weeks. Pt. Is currently only doing 30 clockwise, and 30 counterclockwise pendulums. 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    Limitations Lifting;Reading;Walking;Writing;House hold activities   How long can you sit comfortably? n/a   How long can you stand comfortably? n/a   How long can you walk comfortably? 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 No/denies      Circuit 1: 2x  1. Up up down down pushups 5x (power pushups)  2. Cross body body blade D2 pattern with 3 second holds top 8x.  3. Jimmye Norman carry 45lb 100 ft Circuit 2:2x  1. Row narrow grip 60lb 10x  1. Row wide grip 60lb 8x  Circuit 3: 2x  1.Tricep pushdown, SUE, slight internal rotation for increased tricep activation 13lb 12x each arm  2. Drive exercises: 1L24 seconds  Circuit 4: 2x  1. Bicep curl 15lb 12x slight internal rotation for activation   2. Arnolds 5lb weight 8x                                PT Education - 12/16/16 1737    Education provided Yes   Education Details functional strengthening   Person(s) Educated Patient   Methods Explanation;Demonstration;Verbal cues   Comprehension Verbalized understanding;Returned demonstration          PT Short Term Goals - 11/12/16 1543      PT SHORT TERM GOAL #1   Title Patient will demonstrate  LUE strength of 4+/5 to return to competative collegiate basketball    Baseline 9/12: 4/5   Time 2   Period Weeks   Status New           PT Long Term Goals - 11/12/16 1536      PT LONG TERM GOAL #1   Title Patient will improve shoulder ROM to > 140 degrees of flexion, scaption, and abduction for improved ability to perform overhead activities.   Baseline 9/12: flex 169, abd 179 8/8:  Flexion: arom=125, abduction arom=131; 7/9pt limited to 90 flexion and abduction per protocol at this time   Time 6   Period Weeks   Status Achieved     PT LONG TERM GOAL #2   Title Patient will decrease Quick DASH  score by > 8 (to 5%) points demonstrating reduced self-reported upper extremity disability.   Baseline 8/8: 0%; 7/9: 13.63,    Time 6   Period Weeks   Status Achieved     PT LONG TERM GOAL #3   Title Patient will demonstrate LUE strength of 5/5 to return to competative collegiate basketball    Baseline 9/12: 4/5 8/8: L grossly 4-/5, R 5/5 7/9: will assess in 3 weeks   Time 6   Period Weeks   Status Revised     PT LONG TERM GOAL #4   Title Pt. LUE will be equal to RUE strength within 5 lbs allowing pt. to return to weight lifting regime.    Baseline 9/12: LUE limited > RUE in weight lifting by >5lb 8/7: LUE weaker than RUE; 7/9: unable to lift at this time   Time 6   Period Weeks   Status On-going     PT LONG TERM GOAL #5   Title Patient will decrease QuickDash sports module to <50% to allow for pt. to return to previous level of function   Baseline 9/12: 12.5 %8/8: 100%   Time 6   Period Weeks   Status Achieved     Additional Long Term Goals   Additional Long Term Goals Yes     PT LONG TERM GOAL #6   Title Patient will decrease QuickDash sports module to 0% to allow for pt. to return to previous level of function   Baseline 9/12: 12.5%   Time 6   Period Weeks   Status New     PT LONG TERM GOAL #7   Title Patient will return to playing basketball without limitations for return to previous level of function.    Baseline 9/12: unable to return to sport yet   Time 6   Period Weeks   Status New               Plan - 12/16/16 1742    Clinical Impression Statement Patient progressing with functional strength and weight. Continues to be limited by overhead movement due to UE fatigue. Patient will continue to benefit from skilled physical therapy to allow patient to return to previous level of function.    Rehab Potential Excellent   Clinical Impairments Affecting Rehab Potential  (-) s/p surgery, adherance to wearing sling, (+) desire to return to sport, age   PT  Frequency 2x / week   PT Duration 6 weeks   PT Treatment/Interventions ADLs/Self Care Home Management;Biofeedback;Cryotherapy;Electrical Stimulation;Iontophoresis /ml Dexamethasone;Moist Heat;Ultrasound;Therapeutic activities;Functional mobility training;Therapeutic exercise;Neuromuscular re-education;Patient/family education;Manual techniques;Compression bandaging;Scar mobilization;Passive range of motion;Dry needling;Energy conservation;Splinting;Taping;Visual/perceptual remediation/compensation   PT Next Visit Plan recert?   PT Home Exercise Plan see sheet  Consulted and Agree with Plan of Care Patient      Patient will benefit from skilled therapeutic intervention in order to improve the following deficits and impairments:  Decreased knowledge of precautions, Decreased mobility, Decreased range of motion, Decreased safety awareness, Decreased strength, Decreased scar mobility, Hypomobility, Impaired UE functional use, Improper body mechanics, Postural dysfunction  Visit Diagnosis: Stiffness of left shoulder, not elsewhere classified  Muscle weakness (generalized)  Abnormal posture     Problem List Patient Active Problem List   Diagnosis Date Noted  . History of arthroscopy of left shoulder 09/08/2016   Precious Bard, PT, DPT   Precious Bard 12/16/2016, 5:43 PM  [place] [facility] MAIN [facility] SERVICES [address] [place], [place], [address] Phone: [phone]   Fax:  [phone]  Name: William Humphrey MRN: [id number] Date of Birth: Sep 12, 1995

## 2016-12-18 ENCOUNTER — Ambulatory Visit: Payer: 59

## 2016-12-18 DIAGNOSIS — R293 Abnormal posture: Secondary | ICD-10-CM | POA: Diagnosis not present

## 2016-12-18 DIAGNOSIS — M6281 Muscle weakness (generalized): Secondary | ICD-10-CM

## 2016-12-18 DIAGNOSIS — M25612 Stiffness of left shoulder, not elsewhere classified: Secondary | ICD-10-CM

## 2016-12-18 NOTE — Therapy (Signed)
[place] MAIN [facility] SERVICES [address] [place], [place], [address] Phone: [phone]   Fax:  [phone]  Physical Therapy Treatment  Patient Details  Name: William Humphrey MRN: [id number] Date of Birth: 09-04-1995 Referring Provider: [name], MD  Encounter Date: 12/18/2016      PT End of Session - 12/18/16 2214    Visit Number 27   Number of Visits 46   Date for PT Re-Evaluation 01/29/17   PT Start Time 3428   PT Stop Time 1730   PT Time Calculation (min) 45 min   Activity Tolerance Patient tolerated treatment well   Behavior During Therapy [facility] for tasks assessed/performed      History reviewed. No pertinent past medical history.  Past Surgical History:  Procedure Laterality Date  . SHOULDER ARTHROSCOPY WITH LABRAL REPAIR Left 08/05/2016   Procedure: LEFT SHOULDER ARTHROSCOPY WITH LABRAL REPAIR;  Surgeon: Meredith Pel, MD;  Location: [place];  Service: Orthopedics;  Laterality: Left;  . TOOTH EXTRACTION  2008    There were no vitals filed for this visit.          QuickDash Sport: 12.5%  Circuit 1:  Performed 2x  1. Overhead body blade 60 second holds  2. Holding 10lb plate overhead with walking lunges to promote stability with pertubations  3. Overhead supine arm raises 5lb 10x  Circuit 2: performed 2x  1. Bicep curl TRX 10x  2. Pushup TRX 10x  3. [place] 3: performed 2x  1. Cross body wood chop on cable machine 7.5lb x 12  2. Overhead arm circles 10x each direction with back against wall for decreased compensatory pattern  3. Sliding disc modified pushup on knees 10x  Manual:  Prolonged flexion with scapular stabilizaiton 6x20 second holds  ER with [place] stabilization 6x 20 second holds  subscap STM                                        PT Education - 12/18/16 2214    Education provided Yes   Education Details POC   Person(s) Educated  Patient   Methods Explanation   Comprehension Verbalized understanding          PT Short Term Goals - 12/18/16 2216      PT SHORT TERM GOAL #1   Title Patient will demonstrate LUE strength of 4+/5 to return to competative collegiate basketball    Baseline 9/12: 4/5 10/18: 4+   Time 2   Period Weeks   Status Achieved     PT SHORT TERM GOAL #2   Title Patient will demonstrate LUE strength of 5/5 to return to competative collegiate basketball    Baseline 10/18: 4+/5   Time 2   Period Weeks   Status New     PT SHORT TERM GOAL #3   Title Patient will be independent with HEP to progress towards more independent home program for return to sport.    Baseline HEP modified    Time 2   Period Weeks   Status New           PT Long Term Goals - 12/18/16 2218      PT LONG TERM GOAL #1   Title Patient will improve shoulder ROM to > 140 degrees of flexion, scaption, and abduction for improved ability to perform overhead activities.   Baseline  9/12: flex 169, abd 179 8/8:  Flexion: arom=125, abduction arom=131; 7/9pt limited to 90 flexion and abduction per protocol at this time   Time 6   Period Weeks   Status Achieved     PT LONG TERM GOAL #2   Title Patient will decrease Quick DASH score by > 8 (to 5%) points demonstrating reduced self-reported upper extremity disability.   Baseline 8/8: 0%; 7/9: 13.63,    Time 6   Period Weeks   Status Achieved     PT LONG TERM GOAL #3   Title Patient will demonstrate LUE strength of 5/5 to return to competative collegiate basketball    Baseline 9/12: 4/5 8/8: L grossly 4-/5, R 5/5 7/9: will assess in 3 weeks 10/18: 4+/5   Time 6   Period Weeks   Status Partially Met     PT LONG TERM GOAL #4   Title Pt. LUE will be equal to RUE strength within 5 lbs allowing pt. to return to weight lifting regime.    Baseline LUE weaker than RUE at this time by >10lb depending upon muscle group activation    Time 6   Period Weeks   Status On-going      PT LONG TERM GOAL #5   Title Patient will decrease QuickDash sports module to <50% to allow for pt. to return to previous level of function   Baseline 9/12: 12.5 %8/8: 100%   Time 6   Period Weeks   Status Achieved     PT LONG TERM GOAL #6   Title Patient will decrease QuickDash sports module to 0% to allow for pt. to return to previous level of function   Baseline 9/12: 12.5% 20/28: 12.5%   Time 6   Period Weeks   Status On-going     PT LONG TERM GOAL #7   Title Patient will return to playing basketball without limitations for return to previous level of function.    Baseline 10/18: beginning to practice with brother and friends, limited in contact   Time 6   Period Weeks   Status Partially Met               Plan - 12/18/16 2215    Clinical Impression Statement Patient progressing towards goals with increased strength and stability of L shoulder. QuickDash sport =12.5%. Patient strength nearing equal of R, however continues to be weaker in overhead and external rotation movements at this time. ROM WFL of LUE, will focus primarily on return to sport, strengthening, and activation of correct musculature at this time. Patient will continue to benefit from skilled physical therapy to improve L shoulder strength and return patient to previous level of function to allow for return to play.    Rehab Potential Excellent   Clinical Impairments Affecting Rehab Potential  (-) s/p surgery, adherance to wearing sling, (+) desire to return to sport, age   PT Frequency 2x / week   PT Duration 6 weeks   PT Treatment/Interventions ADLs/Self Care Home Management;Biofeedback;Cryotherapy;Electrical Stimulation;Iontophoresis 52m/ml Dexamethasone;Moist Heat;Ultrasound;Therapeutic activities;Functional mobility training;Therapeutic exercise;Neuromuscular re-education;Patient/family education;Manual techniques;Compression bandaging;Scar mobilization;Passive range of motion;Dry needling;Energy  conservation;Splinting;Taping;Visual/perceptual remediation/compensation   PT Next Visit Plan overhead strengthening and stability   PT Home Exercise Plan see sheet   Consulted and Agree with Plan of Care Patient      Patient will benefit from skilled therapeutic intervention in order to improve the following deficits and impairments:  Decreased knowledge of precautions, Decreased mobility, Decreased range of motion, Decreased safety awareness,  Decreased strength, Decreased scar mobility, Hypomobility, Impaired UE functional use, Improper body mechanics, Postural dysfunction  Visit Diagnosis: Stiffness of left shoulder, not elsewhere classified  Muscle weakness (generalized)  Abnormal posture     Problem List Patient Active Problem List   Diagnosis Date Noted  . History of arthroscopy of left shoulder 09/08/2016   Janna Arch, PT, DPT   Janna Arch 12/18/2016, 10:21 PM  [place] MAIN [facility] SERVICES [address] [place], [place], [address] Phone: [phone]   Fax:  [phone]  Name: William Humphrey MRN: [id number] Date of Birth: December 17, 1995

## 2016-12-22 NOTE — Addendum Note (Signed)
Addended by: Claudie FishermanMOSER, Arpan Eskelson N on: 12/22/2016 08:39 AM   Modules accepted: Orders

## 2016-12-23 ENCOUNTER — Ambulatory Visit: Payer: 59

## 2016-12-25 ENCOUNTER — Ambulatory Visit: Payer: 59

## 2016-12-25 DIAGNOSIS — M6281 Muscle weakness (generalized): Secondary | ICD-10-CM

## 2016-12-25 DIAGNOSIS — R293 Abnormal posture: Secondary | ICD-10-CM

## 2016-12-25 DIAGNOSIS — M25612 Stiffness of left shoulder, not elsewhere classified: Secondary | ICD-10-CM

## 2016-12-25 NOTE — Therapy (Signed)
[place] MAIN [facility] SERVICES [address] [place], [place], [address] Phone: [phone]   Fax:  [phone]  Physical Therapy Treatment  Patient Details  Name: William Humphrey MRN: [id number] Date of Birth: November 05, 1995 Referring Provider: [name], MD  Encounter Date: 12/25/2016      PT End of Session - 12/25/16 1744    Visit Number 28   Number of Visits 46   Date for PT Re-Evaluation 01/29/17   PT Start Time 0277   PT Stop Time 1730   PT Time Calculation (min) 45 min   Activity Tolerance Patient tolerated treatment well   Behavior During Therapy [facility] for tasks assessed/performed      History reviewed. No pertinent past medical history.  Past Surgical History:  Procedure Laterality Date  . SHOULDER ARTHROSCOPY WITH LABRAL REPAIR Left 08/05/2016   Procedure: LEFT SHOULDER ARTHROSCOPY WITH LABRAL REPAIR;  Surgeon: Meredith Pel, MD;  Location: [place];  Service: Orthopedics;  Laterality: Left;  . TOOTH EXTRACTION  2008    There were no vitals filed for this visit.      Subjective Assessment - 12/25/16 1743    Subjective Patient compliant with HEP, hasn't had any pain.    Pertinent History Pt. 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 Prior to surgery pt. sustained anterior dislocation a few years ago and had symptomatic instability since. Pt. had tera of anterior inferior labrum with tear of posterior labrum extending from the 1 oclock position to the 5 oclock position in the left glenoid with intact rotator cuff. Orders request PROM below shoulder level with ER <30, Abd <90, FF <90 and isometrics of RC and deltoid for three weeks. Pt. Is currently only doing 30 clockwise, and 30 counterclockwise pendulums. 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    Limitations Lifting;Reading;Walking;Writing;House hold activities   How long can you sit comfortably? n/a   How long can you stand comfortably? n/a   How long can you walk comfortably? 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 No/denies           Circuit 1 .  1. Half kneel face pull 10x cable machine 20lb 2. Overhead 10lb plate walking lunges 90 ft 3. Side plank 5x +10 seconds after shaking begins  Circuit 2 1. TRX reverse chest fly 10x 2. TRX bicep curl 10x 3. TRX tricep 10x 4. TRX pike 5x Circuit 3  1. Half kneeling shoulder stabilization (face away from cable have cable shoulder height, then flex arm from 90 to 180) 10x flexion 10x scaption, 7.5 lb        2. I's, Ts, Ys, on swiss ball 7 each    Standing posture exercise against wall 8x10 second holds   Pt. response to medical necessity:  Patient will continue to benefit from skilled physical therapy to improve L shoulder strength and return patient to previous level of function for return to play.                    PT Education - 12/25/16 1744    Education provided Yes   Education Details continued stability   Person(s) Educated Patient   Methods Explanation;Demonstration;Verbal cues  Comprehension Verbalized understanding;Returned demonstration          PT Short Term Goals - 12/18/16 2216      PT SHORT TERM GOAL #1   Title Patient will demonstrate LUE strength of 4+/5 to return to competative collegiate basketball    Baseline 9/12: 4/5 10/18: 4+   Time 2   Period Weeks   Status Achieved     PT SHORT TERM GOAL #2   Title Patient will demonstrate LUE strength of 5/5 to return to competative collegiate basketball    Baseline 10/18: 4+/5   Time 2   Period Weeks   Status New     PT SHORT TERM GOAL #3   Title Patient will be independent with HEP to progress  towards more independent home program for return to sport.    Baseline HEP modified    Time 2   Period Weeks   Status New           PT Long Term Goals - 12/18/16 2218      PT LONG TERM GOAL #1   Title Patient will improve shoulder ROM to > 140 degrees of flexion, scaption, and abduction for improved ability to perform overhead activities.   Baseline 9/12: flex 169, abd 179 8/8:  Flexion: arom=125, abduction arom=131; 7/9pt limited to 90 flexion and abduction per protocol at this time   Time 6   Period Weeks   Status Achieved     PT LONG TERM GOAL #2   Title Patient will decrease Quick DASH score by > 8 (to 5%) points demonstrating reduced self-reported upper extremity disability.   Baseline 8/8: 0%; 7/9: 13.63,    Time 6   Period Weeks   Status Achieved     PT LONG TERM GOAL #3   Title Patient will demonstrate LUE strength of 5/5 to return to competative collegiate basketball    Baseline 9/12: 4/5 8/8: L grossly 4-/5, R 5/5 7/9: will assess in 3 weeks 10/18: 4+/5   Time 6   Period Weeks   Status Partially Met     PT LONG TERM GOAL #4   Title Pt. LUE will be equal to RUE strength within 5 lbs allowing pt. to return to weight lifting regime.    Baseline LUE weaker than RUE at this time by >10lb depending upon muscle group activation    Time 6   Period Weeks   Status On-going     PT LONG TERM GOAL #5   Title Patient will decrease QuickDash sports module to <50% to allow for pt. to return to previous level of function   Baseline 9/12: 12.5 %8/8: 100%   Time 6   Period Weeks   Status Achieved     PT LONG TERM GOAL #6   Title Patient will decrease QuickDash sports module to 0% to allow for pt. to return to previous level of function   Baseline 9/12: 12.5% 20/28: 12.5%   Time 6   Period Weeks   Status On-going     PT LONG TERM GOAL #7   Title Patient will return to playing basketball without limitations for return to previous level of function.    Baseline 10/18:  beginning to practice with brother and friends, limited in contact   Time 6   Period Weeks   Status Partially Met               Plan - 12/25/16 1747    Clinical Impression Statement Patient challenged by  stability exercises with cable machine in half kneeling position. Posterior shoulder musculature continues to require further stabilization progression to allow for return to sport. Patient will continue to benefit from skilled physical therapy to improve L shoulder strength and return patient to previous level of function for return to play.    Rehab Potential Excellent   Clinical Impairments Affecting Rehab Potential  (-) s/p surgery, adherance to wearing sling, (+) desire to return to sport, age   PT Frequency 2x / week   PT Duration 6 weeks   PT Treatment/Interventions ADLs/Self Care Home Management;Biofeedback;Cryotherapy;Electrical Stimulation;Iontophoresis 61m/ml Dexamethasone;Moist Heat;Ultrasound;Therapeutic activities;Functional mobility training;Therapeutic exercise;Neuromuscular re-education;Patient/family education;Manual techniques;Compression bandaging;Scar mobilization;Passive range of motion;Dry needling;Energy conservation;Splinting;Taping;Visual/perceptual remediation/compensation   PT Next Visit Plan overhead strengthening and stability   PT Home Exercise Plan see sheet   Consulted and Agree with Plan of Care Patient      Patient will benefit from skilled therapeutic intervention in order to improve the following deficits and impairments:  Decreased knowledge of precautions, Decreased mobility, Decreased range of motion, Decreased safety awareness, Decreased strength, Decreased scar mobility, Hypomobility, Impaired UE functional use, Improper body mechanics, Postural dysfunction  Visit Diagnosis: Stiffness of left shoulder, not elsewhere classified  Muscle weakness (generalized)  Abnormal posture     Problem List Patient Active Problem List   Diagnosis  Date Noted  . History of arthroscopy of left shoulder 09/08/2016   MJanna Arch PT, DPT   MJanna Arch10/25/2018, 5:48 PM  [place] [facility] [address] [address]Phone: [phone]  Fax:  [phone] Name: William PRINSENMRN: [id number] of Birth: [birth year]

## 2016-12-30 ENCOUNTER — Ambulatory Visit: Payer: 59

## 2016-12-30 DIAGNOSIS — M25612 Stiffness of left shoulder, not elsewhere classified: Secondary | ICD-10-CM

## 2016-12-30 DIAGNOSIS — M6281 Muscle weakness (generalized): Secondary | ICD-10-CM | POA: Diagnosis not present

## 2016-12-30 DIAGNOSIS — R293 Abnormal posture: Secondary | ICD-10-CM

## 2016-12-30 NOTE — Therapy (Signed)
[place] MAIN [facility] SERVICES [address] [place], [place], [address] Phone: [phone]   Fax:  [phone]  Physical Therapy Treatment  Patient Details  Name: William Humphrey MRN: [id number] Date of Birth: 06/14/1995 Referring Provider: [name], MD  Encounter Date: 12/30/2016      PT End of Session - 12/30/16 1736    Visit Number 29   Number of Visits 46   Date for PT Re-Evaluation 01/29/17   PT Start Time 7867   PT Stop Time 1731   PT Time Calculation (min) 46 min   Activity Tolerance Patient tolerated treatment well   Behavior During Therapy [facility] for tasks assessed/performed      History reviewed. No pertinent past medical history.  Past Surgical History:  Procedure Laterality Date  . SHOULDER ARTHROSCOPY WITH LABRAL REPAIR Left 08/05/2016   Procedure: LEFT SHOULDER ARTHROSCOPY WITH LABRAL REPAIR;  Surgeon: Meredith Pel, MD;  Location: [place];  Service: Orthopedics;  Laterality: Left;  . TOOTH EXTRACTION  2008    There were no vitals filed for this visit.      Subjective Assessment - 12/30/16 1735    Subjective HEP compliant, no concerns or complaints. 21s continue to challenge patient   Pertinent History Pt. 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 Prior to surgery pt. sustained anterior dislocation a few years ago and had symptomatic instability since. Pt. had tera of anterior inferior labrum with tear of posterior labrum extending from the 1 oclock position to the 5 oclock position in the left glenoid with intact rotator cuff. Orders request PROM below shoulder level with ER <30, Abd <90, FF <90 and isometrics of RC and deltoid for three weeks. Pt. Is currently only doing 30 clockwise, and 30 counterclockwise pendulums. 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    Limitations Lifting;Reading;Walking;Writing;House hold activities   How long can you sit comfortably? n/a   How long can you stand comfortably? n/a   How long can you walk comfortably? 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 No/denies    Each circuit performed 2x  Circuit 1 1. Half kneeling shoulder stabilization (face away from cable have cable shoulder height, then flex arm from 90 to 180) 10x flexion 10x scaption, 7.5 lb 2.I's, Ts, Ys, on swiss ball 7 each Circuit 2:  1. Single UE overhead flexion (180 degree holds) with 5lb weight with ambulation 90 ft 2. Wall walks GTB 6x 3. Bounce ball against wall in full flexion   Circuit 3:  1. Plank cross body adduction with extension/abduction at top range 10x each arm               2. 21's 5lb dumbbell   Circuit 4:   1. 90 90 with 5lb dumbbell and 15lb dumbbell in opp hand in neutral position ambulate 90 ft., tactile cueing for positioning  2. Scapular compression with straight arm 5lb dumbell Leg raises 5x   Child pose to quadruped 10x                             PT Education - 12/30/16 1736    Education provided Yes  Education Details overhead stability movements for sport specific motions   Person(s) Educated Patient   Methods Explanation;Demonstration;Verbal cues   Comprehension Verbalized understanding;Returned demonstration          PT Short Term Goals - 12/18/16 2216      PT SHORT TERM GOAL #1   Title Patient will demonstrate LUE strength of 4+/5 to return to competative collegiate basketball    Baseline 9/12: 4/5 10/18: 4+   Time 2   Period Weeks   Status Achieved     PT SHORT TERM GOAL #2   Title Patient will demonstrate LUE strength of 5/5 to return to competative collegiate basketball    Baseline 10/18: 4+/5   Time 2   Period  Weeks   Status New     PT SHORT TERM GOAL #3   Title Patient will be independent with HEP to progress towards more independent home program for return to sport.    Baseline HEP modified    Time 2   Period Weeks   Status New           PT Long Term Goals - 12/18/16 2218      PT LONG TERM GOAL #1   Title Patient will improve shoulder ROM to > 140 degrees of flexion, scaption, and abduction for improved ability to perform overhead activities.   Baseline 9/12: flex 169, abd 179 8/8:  Flexion: arom=125, abduction arom=131; 7/9pt limited to 90 flexion and abduction per protocol at this time   Time 6   Period Weeks   Status Achieved     PT LONG TERM GOAL #2   Title Patient will decrease Quick DASH score by > 8 (to 5%) points demonstrating reduced self-reported upper extremity disability.   Baseline 8/8: 0%; 7/9: 13.63,    Time 6   Period Weeks   Status Achieved     PT LONG TERM GOAL #3   Title Patient will demonstrate LUE strength of 5/5 to return to competative collegiate basketball    Baseline 9/12: 4/5 8/8: L grossly 4-/5, R 5/5 7/9: will assess in 3 weeks 10/18: 4+/5   Time 6   Period Weeks   Status Partially Met     PT LONG TERM GOAL #4   Title Pt. LUE will be equal to RUE strength within 5 lbs allowing pt. to return to weight lifting regime.    Baseline LUE weaker than RUE at this time by >10lb depending upon muscle group activation    Time 6   Period Weeks   Status On-going     PT LONG TERM GOAL #5   Title Patient will decrease QuickDash sports module to <50% to allow for pt. to return to previous level of function   Baseline 9/12: 12.5 %8/8: 100%   Time 6   Period Weeks   Status Achieved     PT LONG TERM GOAL #6   Title Patient will decrease QuickDash sports module to 0% to allow for pt. to return to previous level of function   Baseline 9/12: 12.5% 20/28: 12.5%   Time 6   Period Weeks   Status On-going     PT LONG TERM GOAL #7   Title Patient will return  to playing basketball without limitations for return to previous level of function.    Baseline 10/18: beginning to practice with brother and friends, limited in contact   Time 6   Period Weeks   Status Partially Met  Plan - 12/30/16 1737    Clinical Impression Statement Patient performing increased intensity interventions overhead demonstrating improved strength and mobility. Stability exercises continue to challenge patient as patient progresses towards more sport specific ranges. Patient continues to benefit from skilled physical therapy to improve L shoulder strength and return patient to previous level of function for return to sport.    Rehab Potential Excellent   Clinical Impairments Affecting Rehab Potential  (-) s/p surgery, adherance to wearing sling, (+) desire to return to sport, age   PT Frequency 2x / week   PT Duration 6 weeks   PT Treatment/Interventions ADLs/Self Care Home Management;Biofeedback;Cryotherapy;Electrical Stimulation;Iontophoresis 70m/ml Dexamethasone;Moist Heat;Ultrasound;Therapeutic activities;Functional mobility training;Therapeutic exercise;Neuromuscular re-education;Patient/family education;Manual techniques;Compression bandaging;Scar mobilization;Passive range of motion;Dry needling;Energy conservation;Splinting;Taping;Visual/perceptual remediation/compensation   PT Next Visit Plan overhead strengthening and stability   PT Home Exercise Plan see sheet   Consulted and Agree with Plan of Care Patient      Patient will benefit from skilled therapeutic intervention in order to improve the following deficits and impairments:  Decreased knowledge of precautions, Decreased mobility, Decreased range of motion, Decreased safety awareness, Decreased strength, Decreased scar mobility, Hypomobility, Impaired UE functional use, Improper body mechanics, Postural dysfunction  Visit Diagnosis: Stiffness of left shoulder, not elsewhere  classified  Muscle weakness (generalized)  Abnormal posture     Problem List Patient Active Problem List   Diagnosis Date Noted  . History of arthroscopy of left shoulder 09/08/2016  MJanna Arch PT, DPT    MJanna Arch10/30/2018, 5:38 PM  [place]MAIN [facility]enter [facility] CenterSERVICES [address] [address]Phone: [phone]  Fax:  [phone] Name: William RESTIVOMRN: [id number] of Birth: [birth year]

## 2017-01-01 ENCOUNTER — Ambulatory Visit: Payer: 59 | Attending: Specialist

## 2017-01-01 DIAGNOSIS — R293 Abnormal posture: Secondary | ICD-10-CM | POA: Diagnosis not present

## 2017-01-01 DIAGNOSIS — M25612 Stiffness of left shoulder, not elsewhere classified: Secondary | ICD-10-CM | POA: Insufficient documentation

## 2017-01-01 DIAGNOSIS — M6281 Muscle weakness (generalized): Secondary | ICD-10-CM | POA: Diagnosis not present

## 2017-01-01 NOTE — Therapy (Signed)
[place] MAIN [facility] SERVICES [address] [place], [place], [address] Phone: [phone]   Fax:  [phone]  Physical Therapy Treatment  Patient Details  Name: William Humphrey MRN: [id number] Date of Birth: [birth year] Referring Provider: [name], MD  Encounter Date: 01/01/2017      PT End of Session - 01/01/17 2050    Visit Number 30   Number of Visits 46   Date for PT Re-Evaluation 01/29/17   PT Start Time 9892   PT Stop Time 1730   PT Time Calculation (min) 45 min   Activity Tolerance Patient tolerated treatment well   Behavior During Therapy [facility] for tasks assessed/performed      History reviewed. No pertinent past medical history.  Past Surgical History:  Procedure Laterality Date  . SHOULDER ARTHROSCOPY WITH LABRAL REPAIR Left 08/05/2016   Procedure: LEFT SHOULDER ARTHROSCOPY WITH LABRAL REPAIR;  Surgeon: Meredith Pel, MD;  Location: [facility];  Service: Orthopedics;  Laterality: Left;  . TOOTH EXTRACTION  2008    There were no vitals filed for this visit.      Subjective Assessment - 01/01/17 2049    Subjective Patient compliant with HEP, progressing towards return to sport activities.    Pertinent History Pt. 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 Prior to surgery pt. sustained anterior dislocation a few years ago and had symptomatic instability since. Pt. had tera of anterior inferior labrum with tear of posterior labrum extending from the 1 oclock position to the 5 oclock position in the left glenoid with intact rotator cuff. Orders request PROM below shoulder level with ER <30, Abd <90, FF <90 and isometrics of RC and deltoid for three weeks. Pt. Is currently only doing 30 clockwise, and 30 counterclockwise pendulums. 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    Limitations Lifting;Reading;Walking;Writing;House hold activities   How long can you sit comfortably? n/a   How long can you stand comfortably? n/a   How long can you walk comfortably? 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 No/denies     Each circuit performed 2x Circuit 1 1. Half kneeling shoulder stabilization (face away from cable have cable shoulder height, then flex arm from 90 to 180) 10xflexion 10x scaption, 7.5 lb 2. Bicep curl cable machine 10.5 10x 3. Tricep pushdown cable machine 7.5lb 10x  Circuit 2:  1. Overhead 10lb plate lunges 90 ft 2. Wall walks GTB 6x 3. Bosu ball burpees 10x   Circuit 3:  1. Plank cross body adduction with extension/abduction at top range 10x each arm         2. Overhead body blade 30 seconds        3.  Plank walk outs 10x  Circuit 4:              1. Arnolds 10lb 6-8x with PT providing tactile cueing  2. Modified fly's on ground with arms bent  3. Child pose 2x 60 seconds                                PT Education - 01/01/17 2050    Education provided Yes  Education Details safe overhead stability movements for sport specific motions   Person(s) Educated Patient   Methods Explanation;Demonstration;Tactile cues;Verbal cues   Comprehension Verbalized understanding;Returned demonstration          PT Short Term Goals - 12/18/16 2216      PT SHORT TERM GOAL #1   Title Patient will demonstrate LUE strength of 4+/5 to return to competative collegiate basketball    Baseline 9/12: 4/5 10/18: 4+   Time 2   Period Weeks   Status Achieved     PT SHORT TERM GOAL #2   Title Patient will demonstrate LUE strength of 5/5 to return to competative collegiate basketball    Baseline 10/18: 4+/5   Time 2   Period Weeks   Status New     PT SHORT TERM GOAL #3    Title Patient will be independent with HEP to progress towards more independent home program for return to sport.    Baseline HEP modified    Time 2   Period Weeks   Status New           PT Long Term Goals - 12/18/16 2218      PT LONG TERM GOAL #1   Title Patient will improve shoulder ROM to > 140 degrees of flexion, scaption, and abduction for improved ability to perform overhead activities.   Baseline 9/12: flex 169, abd 179 8/8:  Flexion: arom=125, abduction arom=131; 7/9pt limited to 90 flexion and abduction per protocol at this time   Time 6   Period Weeks   Status Achieved     PT LONG TERM GOAL #2   Title Patient will decrease Quick DASH score by > 8 (to 5%) points demonstrating reduced self-reported upper extremity disability.   Baseline 8/8: 0%; 7/9: 13.63,    Time 6   Period Weeks   Status Achieved     PT LONG TERM GOAL #3   Title Patient will demonstrate LUE strength of 5/5 to return to competative collegiate basketball    Baseline 9/12: 4/5 8/8: L grossly 4-/5, R 5/5 7/9: will assess in 3 weeks 10/18: 4+/5   Time 6   Period Weeks   Status Partially Met     PT LONG TERM GOAL #4   Title Pt. LUE will be equal to RUE strength within 5 lbs allowing pt. to return to weight lifting regime.    Baseline LUE weaker than RUE at this time by >10lb depending upon muscle group activation    Time 6   Period Weeks   Status On-going     PT LONG TERM GOAL #5   Title Patient will decrease QuickDash sports module to <50% to allow for pt. to return to previous level of function   Baseline 9/12: 12.5 %8/8: 100%   Time 6   Period Weeks   Status Achieved     PT LONG TERM GOAL #6   Title Patient will decrease QuickDash sports module to 0% to allow for pt. to return to previous level of function   Baseline 9/12: 12.5% 20/28: 12.5%   Time 6   Period Weeks   Status On-going     PT LONG TERM GOAL #7   Title Patient will return to playing basketball without limitations for  return to previous level of function.    Baseline 10/18: beginning to practice with brother and friends, limited in contact   Time 6   Period Weeks   Status Partially Met  Plan - 01/01/17 2053    Clinical Impression Statement Patient progressing with overhead stability exercises requiring occasional tactile cueing for body mechanics. Difference of strength between UE's is decreasing. Patient challenged by overhead activities of longer duration due to the need for recruitment of slow twitch fibers. Patient will continue to benefit from skilled physical therapy to improve L shoulder strength and return patient to previous level of function for return to sport.    Rehab Potential Excellent   Clinical Impairments Affecting Rehab Potential  (-) s/p surgery, adherance to wearing sling, (+) desire to return to sport, age   PT Frequency 2x / week   PT Duration 6 weeks   PT Treatment/Interventions ADLs/Self Care Home Management;Biofeedback;Cryotherapy;Electrical Stimulation;Iontophoresis 67m/ml Dexamethasone;Moist Heat;Ultrasound;Therapeutic activities;Functional mobility training;Therapeutic exercise;Neuromuscular re-education;Patient/family education;Manual techniques;Compression bandaging;Scar mobilization;Passive range of motion;Dry needling;Energy conservation;Splinting;Taping;Visual/perceptual remediation/compensation   PT Next Visit Plan overhead strengthening and stability   PT Home Exercise Plan see sheet   Consulted and Agree with Plan of Care Patient      Patient will benefit from skilled therapeutic intervention in order to improve the following deficits and impairments:  Decreased knowledge of precautions, Decreased mobility, Decreased range of motion, Decreased safety awareness, Decreased strength, Decreased scar mobility, Hypomobility, Impaired UE functional use, Improper body mechanics, Postural dysfunction  Visit Diagnosis: Stiffness of left shoulder, not elsewhere  classified  Muscle weakness (generalized)  Abnormal posture     Problem List Patient Active Problem List   Diagnosis Date Noted  . History of arthroscopy of left shoulder 09/08/2016  MJanna Arch PT, DPT    MJanna Arch11/03/2016, 8:53 PM  [place] [address] [address]Phone: [phone]  Fax:  [phone] Name: TBIRNEY BELSHEMRN: [id number] of Birth: [birth year]

## 2017-01-06 ENCOUNTER — Ambulatory Visit: Payer: 59

## 2017-01-08 ENCOUNTER — Ambulatory Visit: Payer: 59 | Admitting: Physical Therapy

## 2017-01-08 ENCOUNTER — Encounter: Payer: Self-pay | Admitting: Physical Therapy

## 2017-01-08 ENCOUNTER — Other Ambulatory Visit: Payer: Self-pay

## 2017-01-08 DIAGNOSIS — R293 Abnormal posture: Secondary | ICD-10-CM | POA: Diagnosis not present

## 2017-01-08 DIAGNOSIS — M6281 Muscle weakness (generalized): Secondary | ICD-10-CM

## 2017-01-08 DIAGNOSIS — M25612 Stiffness of left shoulder, not elsewhere classified: Secondary | ICD-10-CM

## 2017-01-08 NOTE — Therapy (Signed)
[place] MAIN [facility] SERVICES [address] [place], [place], [address] Phone: [phone]   Fax:  [phone]  Physical Therapy Treatment  Patient Details  Name: William Humphrey MRN: [id number] Date of Birth: 1996/03/03 Referring Provider: [name], MD   Encounter Date: 01/08/2017  PT End of Session - 01/08/17 1652    Visit Number  31    Number of Visits  46    Date for PT Re-Evaluation  01/29/17    PT Start Time  7341    PT Stop Time  1729    PT Time Calculation (min)  36 min    Activity Tolerance  Patient tolerated treatment well    Behavior During Therapy  [facility] for tasks assessed/performed       History reviewed. No pertinent past medical history.  Past Surgical History:  Procedure Laterality Date  . TOOTH EXTRACTION  2008    There were no vitals filed for this visit.  Subjective Assessment - 01/08/17 1708    Subjective  Pt reports he is doing well this date.  No new concerns.      Pertinent History  Pt. 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 Prior to surgery pt. sustained anterior dislocation a few years ago and had symptomatic instability since. Pt. had tera of anterior inferior labrum with tear of posterior labrum extending from the 1 oclock position to the 5 oclock position in the left glenoid with intact rotator cuff. Orders request PROM below shoulder level with ER <30, Abd <90, FF <90 and isometrics of RC and deltoid for three weeks. Pt. Is currently only doing 30 clockwise, and 30 counterclockwise pendulums. 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     Limitations  Lifting;Reading;Walking;Writing;House hold activities    How long can you sit comfortably?  n/a    How long can you stand comfortably?  n/a    How long can you walk comfortably?  n/a    Diagnostic tests  ROM, grip strength, quickdash,     Patient Stated Goals  return to playing collegiate basketball    Currently in Pain?  No/denies       Therapeutic Exercise:    Each circuit performed 2x   Circuit 1  1. In standing face away from cable have cable shoulder height, then flex arm from 90 to 180, 10x flexion 10x scaption, 9 lb  2. Bicep curl cable machine 15 lbs 10x  3. Tricep pushdown cable machine 9lb 10x?   Circuit 2:?  1. Overhead 10lb plate lunges 90 ft  2. Wall walks GTB 6x  3. Bosu ball burpees 12x ?  Circuit 3:  1. Plank cross body adduction with extension/abduction at top range 10x each arm?  2. Overhead body blade 30 seconds. On first repetition pt with shoulder in neutral, on second repetition pt with shoulder in internal rotation.  3. Plank walk outs 10x   Circuit 4:  1. Arnolds 10lb 12x with PT providing tactile cueing  2. Side plank on elbow x20 seconds, pt fatigues quickly with this  3. Child pose 2x 60 seconds  4. Theraball on wall and pushing ball up overhead and back down for stretch and stabilization 4x each direction          PT Education - 01/08/17 1652  Education provided  Yes    Education Details  Exercise technique    Person(s) Educated  Patient    Methods  Explanation;Demonstration;Verbal cues    Comprehension  Verbalized understanding;Returned demonstration;Verbal cues required;Need further instruction       PT Short Term Goals - 12/18/16 2216      PT SHORT TERM GOAL #1   Title  Patient will demonstrate LUE strength of 4+/5 to return to competative collegiate basketball     Baseline  9/12: 4/5 10/18: 4+    Time  2    Period  Weeks    Status  Achieved      PT SHORT TERM GOAL #2   Title  Patient will demonstrate LUE strength of 5/5 to return to competative collegiate basketball     Baseline  10/18: 4+/5    Time  2    Period  Weeks    Status  New      PT SHORT TERM GOAL #3    Title  Patient will be independent with HEP to progress towards more independent home program for return to sport.     Baseline  HEP modified     Time  2    Period  Weeks    Status  New        PT Long Term Goals - 12/18/16 2218      PT LONG TERM GOAL #1   Title  Patient will improve shoulder ROM to > 140 degrees of flexion, scaption, and abduction for improved ability to perform overhead activities.    Baseline  9/12: flex 169, abd 179 8/8:  Flexion: arom=125, abduction arom=131; 7/9pt limited to 90 flexion and abduction per protocol at this time    Time  6    Period  Weeks    Status  Achieved      PT LONG TERM GOAL #2   Title  Patient will decrease Quick DASH score by > 8 (to 5%) points demonstrating reduced self-reported upper extremity disability.    Baseline  8/8: 0%; 7/9: 13.63,     Time  6    Period  Weeks    Status  Achieved      PT LONG TERM GOAL #3   Title  Patient will demonstrate LUE strength of 5/5 to return to competative collegiate basketball     Baseline  9/12: 4/5 8/8: L grossly 4-/5, R 5/5 7/9: will assess in 3 weeks 10/18: 4+/5    Time  6    Period  Weeks    Status  Partially Met      PT LONG TERM GOAL #4   Title  Pt. LUE will be equal to RUE strength within 5 lbs allowing pt. to return to weight lifting regime.     Baseline  LUE weaker than RUE at this time by >10lb depending upon muscle group activation     Time  6    Period  Weeks    Status  On-going      PT LONG TERM GOAL #5   Title  Patient will decrease QuickDash sports module to <50% to allow for pt. to return to previous level of function    Baseline  9/12: 12.5 %8/8: 100%    Time  6    Period  Weeks    Status  Achieved      PT LONG TERM GOAL #6   Title  Patient will decrease QuickDash sports module to 0% to allow for pt. to  return to previous level of function    Baseline  9/12: 12.5% 20/28: 12.5%    Time  6    Period  Weeks    Status  On-going      PT LONG TERM GOAL #7   Title   Patient will return to playing basketball without limitations for return to previous level of function.     Baseline  10/18: beginning to practice with brother and friends, limited in contact    Time  6    Period  Weeks    Status  Partially Met            Plan - 01/08/17 1708    Clinical Impression Statement  Progressed strength training this session with increase in weight. Pt tolerated all therapeutic exercises well and demonstrates fatigue at end of each set.  Pt specifically provided with cues to push to fatigue with each exercise.  He demonstrated signficant fatigue with side plank and was instructed to incorporate this exercise into his exercises at the gym/home.  He will benefit from continued skilled PT interventions for return to sport.     Rehab Potential  Excellent    Clinical Impairments Affecting Rehab Potential   (-) s/p surgery, adherance to wearing sling, (+) desire to return to sport, age    PT Frequency  2x / week    PT Duration  6 weeks    PT Treatment/Interventions  ADLs/Self Care Home Management;Biofeedback;Cryotherapy;Electrical Stimulation;Iontophoresis 2m/ml Dexamethasone;Moist Heat;Ultrasound;Therapeutic activities;Functional mobility training;Therapeutic exercise;Neuromuscular re-education;Patient/family education;Manual techniques;Compression bandaging;Scar mobilization;Passive range of motion;Dry needling;Energy conservation;Splinting;Taping;Visual/perceptual remediation/compensation    PT Next Visit Plan  overhead strengthening and stability    PT Home Exercise Plan  see sheet    Consulted and Agree with Plan of Care  Patient       Patient will benefit from skilled therapeutic intervention in order to improve the following deficits and impairments:  Decreased knowledge of precautions, Decreased mobility, Decreased range of motion, Decreased safety awareness, Decreased strength, Decreased scar mobility, Hypomobility, Impaired UE functional use, Improper body  mechanics, Postural dysfunction  Visit Diagnosis: Stiffness of left shoulder, not elsewhere classified  Muscle weakness (generalized)  Abnormal posture     Problem List Patient Active Problem List   Diagnosis Date Noted  . History of arthroscopy of left shoulder 09/08/2016    ACollie SiadPT, DPT 01/08/2017, 5:41 PM  [place]MAIN [facility]SERV[facility] [address] [place] [address]Phone: [phone]  Fax:  [phone] Name: William CARNEIROMRN: [id number] of Birth: 601-Sep-1997

## 2017-01-13 ENCOUNTER — Ambulatory Visit: Payer: 59

## 2017-01-13 DIAGNOSIS — R293 Abnormal posture: Secondary | ICD-10-CM | POA: Diagnosis not present

## 2017-01-13 DIAGNOSIS — M6281 Muscle weakness (generalized): Secondary | ICD-10-CM | POA: Diagnosis not present

## 2017-01-13 DIAGNOSIS — M25612 Stiffness of left shoulder, not elsewhere classified: Secondary | ICD-10-CM

## 2017-01-13 NOTE — Therapy (Signed)
[place] MAIN [facility] SERVICES [address] [place], [place], [address] Phone: [phone]   Fax:  [phone]  Physical Therapy Treatment  Patient Details  Name: William Humphrey MRN: [id number] Date of Birth: 1995/06/04 Referring Provider: [name], MD   Encounter Date: 01/13/2017  PT End of Session - 01/13/17 1743    Visit Number  32    Number of Visits  46    Date for PT Re-Evaluation  01/29/17    PT Start Time  5284    PT Stop Time  1732    PT Time Calculation (min)  45 min    Activity Tolerance  Patient tolerated treatment well    Behavior During Therapy  [facility] for tasks assessed/performed       History reviewed. No pertinent past medical history.  Past Surgical History:  Procedure Laterality Date  . TOOTH EXTRACTION  2008    There were no vitals filed for this visit.  Subjective Assessment - 01/13/17 1742    Subjective  Patient reports compliance with HEP, has not done side planks yet though at this time.     Pertinent History  Pt. 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 Prior to surgery pt. sustained anterior dislocation a few years ago and had symptomatic instability since. Pt. had tera of anterior inferior labrum with tear of posterior labrum extending from the 1 oclock position to the 5 oclock position in the left glenoid with intact rotator cuff. Orders request PROM below shoulder level with ER <30, Abd <90, FF <90 and isometrics of RC and deltoid for three weeks. Pt. Is currently only doing 30 clockwise, and 30 counterclockwise pendulums. 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     Limitations  Lifting;Reading;Walking;Writing;House hold activities    How long can you sit comfortably?  n/a    How long can you  stand comfortably?  n/a    How long can you walk comfortably?  n/a    Diagnostic tests  ROM, grip strength, quickdash,     Patient Stated Goals  return to playing collegiate basketball    Currently in Pain?  No/denies       Circuit 1: 2x             1. Up up down down pushups 5x (power pushups)             2. Cross body body blade D2 pattern with 3 second holds top 8x.             3. Mare Ferrari carry 45lb 100 ft  Circuit 2: 2x  1. Stop game with 10lb plate  2.  Overhead chest raises 10lb dumbbells  3. Side plank with 5 second count down upon trembling 5x  Circuit 3 : 2x  1. burpee with hold at bottom pushup 5x with 20 second holds  2. arnolds with 10lb dumbbells 10x  3. Overhead flexion body blade raises with 5 second holds at top flexion 5x  Circuit 4: 2x  1. tricep pushup on 8lb weighted ball  2. Clockwise and counterclockwise circles with 8lb ball 10x each direction  3. Child pose                           PT Education -  01/13/17 1742    Education provided  Yes    Education Details  functional body mechanics for strengthening    Person(s) Educated  Patient    Methods  Explanation;Demonstration;Verbal cues    Comprehension  Verbalized understanding;Returned demonstration       PT Short Term Goals - 12/18/16 2216      PT SHORT TERM GOAL #1   Title  Patient will demonstrate LUE strength of 4+/5 to return to competative collegiate basketball     Baseline  9/12: 4/5 10/18: 4+    Time  2    Period  Weeks    Status  Achieved      PT SHORT TERM GOAL #2   Title  Patient will demonstrate LUE strength of 5/5 to return to competative collegiate basketball     Baseline  10/18: 4+/5    Time  2    Period  Weeks    Status  New      PT SHORT TERM GOAL #3   Title  Patient will be independent with HEP to progress towards more independent home program for return to sport.     Baseline  HEP modified     Time  2    Period  Weeks    Status  New        PT  Long Term Goals - 12/18/16 2218      PT LONG TERM GOAL #1   Title  Patient will improve shoulder ROM to > 140 degrees of flexion, scaption, and abduction for improved ability to perform overhead activities.    Baseline  9/12: flex 169, abd 179 8/8:  Flexion: arom=125, abduction arom=131; 7/9pt limited to 90 flexion and abduction per protocol at this time    Time  6    Period  Weeks    Status  Achieved      PT LONG TERM GOAL #2   Title  Patient will decrease Quick DASH score by > 8 (to 5%) points demonstrating reduced self-reported upper extremity disability.    Baseline  8/8: 0%; 7/9: 13.63,     Time  6    Period  Weeks    Status  Achieved      PT LONG TERM GOAL #3   Title  Patient will demonstrate LUE strength of 5/5 to return to competative collegiate basketball     Baseline  9/12: 4/5 8/8: L grossly 4-/5, R 5/5 7/9: will assess in 3 weeks 10/18: 4+/5    Time  6    Period  Weeks    Status  Partially Met      PT LONG TERM GOAL #4   Title  Pt. LUE will be equal to RUE strength within 5 lbs allowing pt. to return to weight lifting regime.     Baseline  LUE weaker than RUE at this time by >10lb depending upon muscle group activation     Time  6    Period  Weeks    Status  On-going      PT LONG TERM GOAL #5   Title  Patient will decrease QuickDash sports module to <50% to allow for pt. to return to previous level of function    Baseline  9/12: 12.5 %8/8: 100%    Time  6    Period  Weeks    Status  Achieved      PT LONG TERM GOAL #6   Title  Patient will decrease QuickDash sports module to 0% to  allow for pt. to return to previous level of function    Baseline  9/12: 12.5% 20/28: 12.5%    Time  6    Period  Weeks    Status  On-going      PT LONG TERM GOAL #7   Title  Patient will return to playing basketball without limitations for return to previous level of function.     Baseline  10/18: beginning to practice with brother and friends, limited in contact    Time  6     Period  Weeks    Status  Partially Met            Plan - 01/13/17 1744    Clinical Impression Statement  Patient progressing with functional strength and mobility. Decreased postural cueing required for correction during exercises demonstrating improved postural control. Patient still challenged by prolonged overhead motions at this time. Patient will continue to benefit from skilled physical therapy to return to sport.     Rehab Potential  Excellent    Clinical Impairments Affecting Rehab Potential   (-) s/p surgery, adherance to wearing sling, (+) desire to return to sport, age    PT Frequency  2x / week    PT Duration  6 weeks    PT Treatment/Interventions  ADLs/Self Care Home Management;Biofeedback;Cryotherapy;Electrical Stimulation;Iontophoresis 22m/ml Dexamethasone;Moist Heat;Ultrasound;Therapeutic activities;Functional mobility training;Therapeutic exercise;Neuromuscular re-education;Patient/family education;Manual techniques;Compression bandaging;Scar mobilization;Passive range of motion;Dry needling;Energy conservation;Splinting;Taping;Visual/perceptual remediation/compensation    PT Next Visit Plan  overhead strengthening and stability    PT Home Exercise Plan  see sheet    Consulted and Agree with Plan of Care  Patient       Patient will benefit from skilled therapeutic intervention in order to improve the following deficits and impairments:  Decreased knowledge of precautions, Decreased mobility, Decreased range of motion, Decreased safety awareness, Decreased strength, Decreased scar mobility, Hypomobility, Impaired UE functional use, Improper body mechanics, Postural dysfunction  Visit Diagnosis: Stiffness of left shoulder, not elsewhere classified  Muscle weakness (generalized)  Abnormal posture     Problem List Patient Active Problem List   Diagnosis Date Noted  . History of arthroscopy of left shoulder 09/08/2016  MJanna Arch PT, DPT    MJanna Arch11/13/2018, 5:45 PM  CSpotsylvania CourthouseMAIN RSparrow Specialty HospitalSERVICES [address] StreetRPleasant Hills [place] [address]Phone: [phone]  Fax:  [phone] Name: TKHAIR CHASTEENMRN: [id number] of Birth: [birth year]

## 2017-01-15 ENCOUNTER — Ambulatory Visit: Payer: 59

## 2017-01-15 DIAGNOSIS — M25612 Stiffness of left shoulder, not elsewhere classified: Secondary | ICD-10-CM | POA: Diagnosis not present

## 2017-01-15 DIAGNOSIS — M6281 Muscle weakness (generalized): Secondary | ICD-10-CM | POA: Diagnosis not present

## 2017-01-15 DIAGNOSIS — R293 Abnormal posture: Secondary | ICD-10-CM

## 2017-01-15 NOTE — Therapy (Signed)
[place] MAIN [facility] SERVICES [address] [place], [place], [address] Phone: [phone]   Fax:  [phone]  Physical Therapy Treatment  Patient Details  Name: William Humphrey MRN: [id number] Date of Birth: [birth year] Referring Provider: [name], MD   Encounter Date: 01/15/2017  PT End of Session - 01/15/17 1657    Visit Number  33    Number of Visits  46    Date for PT Re-Evaluation  01/29/17    PT Start Time  1608    PT Stop Time  1647    PT Time Calculation (min)  39 min    Activity Tolerance  Patient tolerated treatment well    Behavior During Therapy  [facility] for tasks assessed/performed       History reviewed. No pertinent past medical history.  Past Surgical History:  Procedure Laterality Date  . SHOULDER ARTHROSCOPY WITH LABRAL REPAIR Left 08/05/2016   Procedure: LEFT SHOULDER ARTHROSCOPY WITH LABRAL REPAIR;  Surgeon: Meredith Pel, MD;  Location: [place];  Service: Orthopedics;  Laterality: Left;  . TOOTH EXTRACTION  2008    There were no vitals filed for this visit.  Subjective Assessment - 01/15/17 1656    Subjective  Patient compliant with HEP however has not implemented side plank into HEP. No compliants or concerns.    Pertinent History  Pt. 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 Prior to surgery pt. sustained anterior dislocation a few years ago and had symptomatic instability since. Pt. had tera of anterior inferior labrum with tear of posterior labrum extending from the 1 oclock position to the 5 oclock position in the left glenoid with intact rotator cuff. Orders request PROM below shoulder level with ER <30, Abd <90, FF <90 and isometrics of RC and deltoid for three weeks. Pt. Is currently only doing 30 clockwise, and 30 counterclockwise pendulums. 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     Limitations  Lifting;Reading;Walking;Writing;House hold activities    How long can you sit comfortably?  n/a    How long can you stand comfortably?  n/a    How long can you walk comfortably?  n/a    Diagnostic tests  ROM, grip strength, quickdash,     Patient Stated Goals  return to playing collegiate basketball    Currently in Pain?  No/denies       Each circuit performed 2x  Circuit 1: 1. Small step plank up up down down 10x each arm up  2. Up up down down plank onto forearms and up to straight arms 10x each arm 3. Plank with leg raise to outside of arm 10x each leg  Circuit 2. 1. 10lb chest flys 10x 2. Downward dog into cobra without touching stomach to floor and back (marine pushup) 5x 3. Flexion body blade with 5 second holds at top 10x  Circuit 3:  1. Pushup with 10lb row 10x each arm 2. Chest press flexion in standing 10x with 10lb dumbbells 3. Abduction body blade 5 second holds overhead   Pt. response to medical necessity:  Patient will continue to benefit from skilled physical therapy to return to sport.                       PT Education - 01/15/17 1656  Education provided  Yes    Education Details  functional body strengthening    Person(s) Educated  Patient    Methods  Explanation;Demonstration;Verbal cues    Comprehension  Verbalized understanding;Returned demonstration       PT Short Term Goals - 12/18/16 2216      PT SHORT TERM GOAL #1   Title  Patient will demonstrate LUE strength of 4+/5 to return to competative collegiate basketball     Baseline  9/12: 4/5 10/18: 4+    Time  2    Period  Weeks    Status  Achieved      PT SHORT TERM GOAL #2   Title  Patient will demonstrate LUE strength of 5/5 to return to competative collegiate basketball     Baseline  10/18: 4+/5    Time  2    Period  Weeks    Status  New      PT SHORT TERM  GOAL #3   Title  Patient will be independent with HEP to progress towards more independent home program for return to sport.     Baseline  HEP modified     Time  2    Period  Weeks    Status  New        PT Long Term Goals - 12/18/16 2218      PT LONG TERM GOAL #1   Title  Patient will improve shoulder ROM to > 140 degrees of flexion, scaption, and abduction for improved ability to perform overhead activities.    Baseline  9/12: flex 169, abd 179 8/8:  Flexion: arom=125, abduction arom=131; 7/9pt limited to 90 flexion and abduction per protocol at this time    Time  6    Period  Weeks    Status  Achieved      PT LONG TERM GOAL #2   Title  Patient will decrease Quick DASH score by > 8 (to 5%) points demonstrating reduced self-reported upper extremity disability.    Baseline  8/8: 0%; 7/9: 13.63,     Time  6    Period  Weeks    Status  Achieved      PT LONG TERM GOAL #3   Title  Patient will demonstrate LUE strength of 5/5 to return to competative collegiate basketball     Baseline  9/12: 4/5 8/8: L grossly 4-/5, R 5/5 7/9: will assess in 3 weeks 10/18: 4+/5    Time  6    Period  Weeks    Status  Partially Met      PT LONG TERM GOAL #4   Title  Pt. LUE will be equal to RUE strength within 5 lbs allowing pt. to return to weight lifting regime.     Baseline  LUE weaker than RUE at this time by >10lb depending upon muscle group activation     Time  6    Period  Weeks    Status  On-going      PT LONG TERM GOAL #5   Title  Patient will decrease QuickDash sports module to <50% to allow for pt. to return to previous level of function    Baseline  9/12: 12.5 %8/8: 100%    Time  6    Period  Weeks    Status  Achieved      PT LONG TERM GOAL #6   Title  Patient will decrease QuickDash sports module to 0% to allow for pt. to return to previous  level of function    Baseline  9/12: 12.5% 20/28: 12.5%    Time  6    Period  Weeks    Status  On-going      PT LONG TERM GOAL #7    Title  Patient will return to playing basketball without limitations for return to previous level of function.     Baseline  10/18: beginning to practice with brother and friends, limited in contact    Time  6    Period  Weeks    Status  Partially Met            Plan - 01/15/17 1659    Clinical Impression Statement  Pt. Performed plank interventions with good body mechanics, however fatigued quickly due to high demand for stability with intervention. Pt. Requires more overhead and 90 90 stability interventions to return to prior level of activity. Patient will continue to benefit from skilled physical therapy to return to sport.     Rehab Potential  Excellent    Clinical Impairments Affecting Rehab Potential   (-) s/p surgery, adherance to wearing sling, (+) desire to return to sport, age    PT Frequency  2x / week    PT Duration  6 weeks    PT Treatment/Interventions  ADLs/Self Care Home Management;Biofeedback;Cryotherapy;Electrical Stimulation;Iontophoresis 59m/ml Dexamethasone;Moist Heat;Ultrasound;Therapeutic activities;Functional mobility training;Therapeutic exercise;Neuromuscular re-education;Patient/family education;Manual techniques;Compression bandaging;Scar mobilization;Passive range of motion;Dry needling;Energy conservation;Splinting;Taping;Visual/perceptual remediation/compensation    PT Next Visit Plan  overhead strengthening and stability    PT Home Exercise Plan  see sheet    Consulted and Agree with Plan of Care  Patient       Patient will benefit from skilled therapeutic intervention in order to improve the following deficits and impairments:  Decreased knowledge of precautions, Decreased mobility, Decreased range of motion, Decreased safety awareness, Decreased strength, Decreased scar mobility, Hypomobility, Impaired UE functional use, Improper body mechanics, Postural dysfunction  Visit Diagnosis: Stiffness of left shoulder, not elsewhere classified  Muscle  weakness (generalized)  Abnormal posture     Problem List Patient Active Problem List   Diagnosis Date Noted  . History of arthroscopy of left shoulder 09/08/2016   MJanna Arch PT, DPT   MJanna Arch11/15/2018, 5:00 PM  CReinholdsMAIN RMusculoskeletal Ambulatory Surgery CenterSERVICES [address]RS[address] [address]Phone: [phone]  Fax:  [phone] Name: William METALLOMRN: [id number] of Birth: 6March 19, 1997

## 2017-01-20 ENCOUNTER — Ambulatory Visit: Payer: 59

## 2017-01-20 DIAGNOSIS — M25612 Stiffness of left shoulder, not elsewhere classified: Secondary | ICD-10-CM | POA: Diagnosis not present

## 2017-01-20 DIAGNOSIS — M6281 Muscle weakness (generalized): Secondary | ICD-10-CM | POA: Diagnosis not present

## 2017-01-20 DIAGNOSIS — R293 Abnormal posture: Secondary | ICD-10-CM | POA: Diagnosis not present

## 2017-01-20 NOTE — Therapy (Signed)
[place] MAIN [facility] SERVICES [address] [place], [place], [address] Phone: [phone]   Fax:  [phone]  Physical Therapy Treatment  Patient Details  Name: William Humphrey MRN: [id number] Date of Birth: October 25, 1995 Referring Provider: [name], MD   Encounter Date: 01/20/2017  PT End of Session - 01/20/17 1738    Visit Number  34    Number of Visits  46    Date for PT Re-Evaluation  01/29/17    PT Start Time  1443    PT Stop Time  1430    PT Time Calculation (min)  45 min    Activity Tolerance  Patient tolerated treatment well    Behavior During Therapy  [facility] for tasks assessed/performed       History reviewed. No pertinent past medical history.  Past Surgical History:  Procedure Laterality Date  . SHOULDER ARTHROSCOPY WITH LABRAL REPAIR Left 08/05/2016   Procedure: LEFT SHOULDER ARTHROSCOPY WITH LABRAL REPAIR;  Surgeon: Meredith Pel, MD;  Location: [place];  Service: Orthopedics;  Laterality: Left;  . TOOTH EXTRACTION  2008    There were no vitals filed for this visit.  Subjective Assessment - 01/20/17 1648    Subjective  Patient prepared to start to transition to more independent program by end of month. Continued HEP compliance    Pertinent History  Pt. 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 Prior to surgery pt. sustained anterior dislocation a few years ago and had symptomatic instability since. Pt. had tera of anterior inferior labrum with tear of posterior labrum extending from the 1 oclock position to the 5 oclock position in the left glenoid with intact rotator cuff. Orders request PROM below shoulder level with ER <30, Abd <90, FF <90 and isometrics of RC and deltoid for three weeks. Pt. Is currently only doing 30 clockwise, and 30 counterclockwise pendulums. 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     Limitations  Lifting;Reading;Walking;Writing;House hold activities    How long can you sit comfortably?  n/a    How long can you stand comfortably?  n/a    How long can you walk comfortably?  n/a    Diagnostic tests  ROM, grip strength, quickdash,     Patient Stated Goals  return to playing collegiate basketball    Currently in Pain?  No/denies      Each circuit performed 2x   Circuit 1  1. High plank low plank up up down down 10x 2. Wall walks GTB 8x  3. Arm circles with 5-10lb plates  Circuit 2:  1. IR/ER with cable machine  2. High pull bar  20lb on each side 10x  3. Single arm row 10x each arm  Circuit 3 1. Kettle bell arm bar (supine with kettlebell overhead roll to sidelying with KB still overhead) 10x 2. Farmers walks 35 ft 25lb                      PT Education - 01/20/17 1737    Education provided  Yes    Education Details  shoulder strengthening interventions     Person(s) Educated  Patient    Methods  Explanation;Demonstration;Verbal cues    Comprehension  Verbalized understanding;Returned demonstration       PT Short Term Goals -  12/18/16 2216      PT SHORT TERM GOAL #1   Title  Patient will demonstrate LUE strength of 4+/5 to return to competative collegiate basketball     Baseline  9/12: 4/5 10/18: 4+    Time  2    Period  Weeks    Status  Achieved      PT SHORT TERM GOAL #2   Title  Patient will demonstrate LUE strength of 5/5 to return to competative collegiate basketball     Baseline  10/18: 4+/5    Time  2    Period  Weeks    Status  New      PT SHORT TERM GOAL #3   Title  Patient will be independent with HEP to progress towards more independent home program for return to sport.     Baseline  HEP modified     Time  2    Period  Weeks    Status  New        PT Long Term Goals - 12/18/16 2218      PT LONG TERM GOAL #1    Title  Patient will improve shoulder ROM to > 140 degrees of flexion, scaption, and abduction for improved ability to perform overhead activities.    Baseline  9/12: flex 169, abd 179 8/8:  Flexion: arom=125, abduction arom=131; 7/9pt limited to 90 flexion and abduction per protocol at this time    Time  6    Period  Weeks    Status  Achieved      PT LONG TERM GOAL #2   Title  Patient will decrease Quick DASH score by > 8 (to 5%) points demonstrating reduced self-reported upper extremity disability.    Baseline  8/8: 0%; 7/9: 13.63,     Time  6    Period  Weeks    Status  Achieved      PT LONG TERM GOAL #3   Title  Patient will demonstrate LUE strength of 5/5 to return to competative collegiate basketball     Baseline  9/12: 4/5 8/8: L grossly 4-/5, R 5/5 7/9: will assess in 3 weeks 10/18: 4+/5    Time  6    Period  Weeks    Status  Partially Met      PT LONG TERM GOAL #4   Title  Pt. LUE will be equal to RUE strength within 5 lbs allowing pt. to return to weight lifting regime.     Baseline  LUE weaker than RUE at this time by >10lb depending upon muscle group activation     Time  6    Period  Weeks    Status  On-going      PT LONG TERM GOAL #5   Title  Patient will decrease QuickDash sports module to <50% to allow for pt. to return to previous level of function    Baseline  9/12: 12.5 %8/8: 100%    Time  6    Period  Weeks    Status  Achieved      PT LONG TERM GOAL #6   Title  Patient will decrease QuickDash sports module to 0% to allow for pt. to return to previous level of function    Baseline  9/12: 12.5% 20/28: 12.5%    Time  6    Period  Weeks    Status  On-going      PT LONG TERM GOAL #7   Title  Patient will  return to playing basketball without limitations for return to previous level of function.     Baseline  10/18: beginning to practice with brother and friends, limited in contact    Time  6    Period  Weeks    Status  Partially Met             Plan - 01/20/17 1739    Clinical Impression Statement  Patient performs strengthening interventions with little to no compensatory patterning. No deviations between left and right shoulder noted, with equal strength noted bilaterally. ER continues to challenge patient. Patient progressing towards home based program by end of month.     Rehab Potential  Excellent    Clinical Impairments Affecting Rehab Potential   (-) s/p surgery, adherance to wearing sling, (+) desire to return to sport, age    PT Frequency  2x / week    PT Duration  6 weeks    PT Treatment/Interventions  ADLs/Self Care Home Management;Biofeedback;Cryotherapy;Electrical Stimulation;Iontophoresis 87m/ml Dexamethasone;Moist Heat;Ultrasound;Therapeutic activities;Functional mobility training;Therapeutic exercise;Neuromuscular re-education;Patient/family education;Manual techniques;Compression bandaging;Scar mobilization;Passive range of motion;Dry needling;Energy conservation;Splinting;Taping;Visual/perceptual remediation/compensation    PT Next Visit Plan  overhead strengthening and stability    PT Home Exercise Plan  see sheet    Consulted and Agree with Plan of Care  Patient       Patient will benefit from skilled therapeutic intervention in order to improve the following deficits and impairments:  Decreased knowledge of precautions, Decreased mobility, Decreased range of motion, Decreased safety awareness, Decreased strength, Decreased scar mobility, Hypomobility, Impaired UE functional use, Improper body mechanics, Postural dysfunction  Visit Diagnosis: Stiffness of left shoulder, not elsewhere classified  Muscle weakness (generalized)  Abnormal posture     Problem List Patient Active Problem List   Diagnosis Date Noted  . History of arthroscopy of left shoulder 09/08/2016   MJanna Arch PT, DPT   MJanna Arch11/20/2018, 5:40 PM  CS[place]MAIN [facility] SERVICES [address] [address]Phone: [phone]  Fax:  [phone] Name: William FONDAMRN: [id number] of Birth: [birth year]

## 2017-01-26 ENCOUNTER — Ambulatory Visit (INDEPENDENT_AMBULATORY_CARE_PROVIDER_SITE_OTHER): Payer: 59 | Admitting: Orthopedic Surgery

## 2017-01-26 ENCOUNTER — Encounter (INDEPENDENT_AMBULATORY_CARE_PROVIDER_SITE_OTHER): Payer: Self-pay | Admitting: Orthopedic Surgery

## 2017-01-26 DIAGNOSIS — Z9889 Other specified postprocedural states: Secondary | ICD-10-CM

## 2017-01-26 NOTE — Progress Notes (Signed)
   Post-Op Visit Note   Patient: William Humphrey           Date of Birth: 09/22/1995           MRN: [id number] Visit Date: 01/26/2017 PCP: Leanna SatoMiles, Linda M, MD   Assessment & Plan:  Chief Complaint:  Chief Complaint  Patient presents with  . Left Shoulder - Follow-up   Visit Diagnoses:  1. Status post labral repair of shoulder     Plan: Treatment and is now 5 months out left shoulder arthroscopy and labral repair.  He's doing well.  He is right-hand dominant.  Physical therapy is ending.  On exam he has excellent range of motion and strength with good solid endpoint to anterior stability testing and posterior stability testing.  Has less than 1 cm sulcus sign.  He has been lifting weights and shooting basketball.  Plan is to release him at this time.  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  I'll see him back as needed  Follow-Up Instructions: Return if symptoms worsen or fail to improve.   Orders:  No orders of the defined types were placed in this encounter.  No orders of the defined types were placed in this encounter.   Imaging: No results found.  PMFS History: Patient Active Problem List   Diagnosis Date Noted  . History of arthroscopy of left shoulder 09/08/2016   History reviewed. No pertinent past medical history.  History reviewed. No pertinent family history.  Past Surgical History:  Procedure Laterality Date  . SHOULDER ARTHROSCOPY WITH LABRAL REPAIR Left 08/05/2016   Procedure: LEFT SHOULDER ARTHROSCOPY WITH LABRAL REPAIR;  Surgeon: Cammy Copaean, Scott Gregory, MD;  [facility];  Service: Orthopedics;  Laterality: Left;  . TOOTH EXTRACTION  2008   Social History   Occupational History  . Not on file  Tobacco Use  . Smoking status: Never Smoker  . Smokeless tobacco: Never Used  Substance and Sexual Activity  . Alcohol use: No  . Drug use: No  . Sexual activity: Not on file

## 2017-01-27 ENCOUNTER — Ambulatory Visit: Payer: 59

## 2017-01-29 ENCOUNTER — Ambulatory Visit: Payer: 59

## 2017-01-29 DIAGNOSIS — M6281 Muscle weakness (generalized): Secondary | ICD-10-CM | POA: Diagnosis not present

## 2017-01-29 DIAGNOSIS — R293 Abnormal posture: Secondary | ICD-10-CM

## 2017-01-29 DIAGNOSIS — M25612 Stiffness of left shoulder, not elsewhere classified: Secondary | ICD-10-CM | POA: Diagnosis not present

## 2017-01-30 NOTE — Therapy (Signed)
[place] [facility] SERVICES [address] [place], [place], [address] Phone: [phone]   Fax:  [phone]  Physical Therapy Treatment  Patient Details  Name: William Humphrey MRN: [id number] Date of Birth: 1995-07-20 Referring Provider: [name], MD   Encounter Date: 01/29/2017  PT End of Session - 01/30/17 0625    Visit Number  35    Number of Visits  46    Date for PT Re-Evaluation  01/29/17    PT Start Time  0962    PT Stop Time  1530    PT Time Calculation (min)  1365 min    Activity Tolerance  Patient tolerated treatment well    Behavior During Therapy  [facility] for tasks assessed/performed       History reviewed. No pertinent past medical history.  Past Surgical History:  Procedure Laterality Date  . SHOULDER ARTHROSCOPY WITH LABRAL REPAIR Left 08/05/2016   Procedure: LEFT SHOULDER ARTHROSCOPY WITH LABRAL REPAIR;  Surgeon: Meredith Pel, MD;  Location: [facility];  Service: Orthopedics;  Laterality: Left;  . TOOTH EXTRACTION  2008    There were no vitals filed for this visit.  Subjective Assessment - 01/30/17 0624    Subjective  Patient prepared to transition to home based program at this time. No questions or concerns. Asked to go over body weight stabilization exercises     Pertinent History  Pt. 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 Prior to surgery pt. sustained anterior dislocation a few years ago and had symptomatic instability since. Pt. had tera of anterior inferior labrum with tear of posterior labrum extending from the 1 oclock position to the 5 oclock position in the left glenoid with intact rotator cuff. Orders request PROM below shoulder level with ER <30, Abd <90, FF <90 and isometrics of RC and deltoid for three weeks. Pt. Is currently only doing 30 clockwise, and 30  counterclockwise pendulums. 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     Limitations  Lifting;Reading;Walking;Writing;House hold activities    How long can you sit comfortably?  n/a    How long can you stand comfortably?  n/a    How long can you walk comfortably?  n/a    Diagnostic tests  ROM, grip strength, quickdash,     Patient Stated Goals  return to playing collegiate basketball    Currently in Pain?  No/denies           QuickDash sport=0% work =0% MMT 5/5 R=L strength, no noted compensatory pattern of recruitment  Each circuit performed 2x  Circuit 1:   1. Slider pushups with opp leg raised 10x  2. 10 counterclockwise and 10 clockwise circles overhead with 10lb plate , cues for decreasing speed and increasing amplitude of circle  3. 90 90 stability walk with 5lb  KB in 90 90 arm and 15 lb KB in straight arm 90 ft  Circuit 2:  1. breakdance plank (opp LE outside-step passed advanced mountain climbers) 5x each side  2. KB stabilization transition from supine to prop sidelying 10x each arm  3. 10lb plate supine overhead chest pulls  Circuit 3:   1. Birddog 10x  2. Straight arm raise with arm ER 5lb KB 10x each arm  3. Side slide pushup 10x  PT Education - 01/30/17 [phone]    Education provided  Yes    Education Details  d/c body weight stabilization, dumbbell/kb stabilization    Person(s) Educated  Patient    Methods  Explanation;Demonstration;Verbal cues    Comprehension  Verbalized understanding;Returned demonstration       PT Short Term Goals - 01/30/17 0641      PT SHORT TERM GOAL #1   Title  Patient will demonstrate LUE strength of 4+/5 to return to competative collegiate basketball     Baseline  9/12: 4/5 10/18: 4+    Time  2    Period  Weeks    Status  Achieved      PT SHORT TERM GOAL #2   Title  Patient will demonstrate LUE strength of  5/5 to return to competative collegiate basketball     Baseline  10/18: 4+/5 11/30: 5/5    Time  2    Period  Weeks    Status  Achieved      PT SHORT TERM GOAL #3   Title  Patient will be independent with HEP to progress towards more independent home program for return to sport.     Baseline  HEP independent    Time  2    Period  Weeks    Status  Achieved        PT Long Term Goals - 01/30/17 5974      PT LONG TERM GOAL #1   Title  Patient will improve shoulder ROM to > 140 degrees of flexion, scaption, and abduction for improved ability to perform overhead activities.    Baseline  9/12: flex 169, abd 179 8/8:  Flexion: arom=125, abduction arom=131; 7/9pt limited to 90 flexion and abduction per protocol at this time    Time  6    Period  Weeks    Status  Achieved      PT LONG TERM GOAL #2   Title  Patient will decrease Quick DASH score by > 8 (to 5%) points demonstrating reduced self-reported upper extremity disability.    Baseline  8/8: 0%; 7/9: 13.63,     Time  6    Period  Weeks    Status  Achieved      PT LONG TERM GOAL #3   Title  Patient will demonstrate LUE strength of 5/5 to return to competative collegiate basketball     Baseline  9/12: 4/5 8/8: L grossly 4-/5, R 5/5 7/9: will assess in 3 weeks 10/18: 4+/5 11/30: 5/5    Time  6    Period  Weeks    Status  Achieved      PT LONG TERM GOAL #4   Title  Pt. LUE will be equal to RUE strength within 5 lbs allowing pt. to return to weight lifting regime.     Baseline  LUE weaker than RUE at this time by >10lb depending upon muscle group activation; 11/30: L=R     Time  6    Period  Weeks    Status  Achieved      PT LONG TERM GOAL #5   Title  Patient will decrease QuickDash sports module to <50% to allow for pt. to return to previous level of function    Baseline  9/12: 12.5 %8/8: 100%    Time  6    Period  Weeks    Status  Achieved      PT LONG TERM GOAL #6   Title  Patient  will decrease QuickDash sports module  to 0% to allow for pt. to return to previous level of function    Baseline  9/12: 12.5% 20/28: 12.5% 11/30: 0%    Time  6    Period  Weeks    Status  Achieved      PT LONG TERM GOAL #7   Title  Patient will return to playing basketball without limitations for return to previous level of function.     Baseline  return to sport    Time  6    Period  Weeks    Status  Achieved            Plan - 01/30/17 6837    Clinical Impression Statement  Patient met all goals at this time. No deviations or compensatory patterns noted with muscle activation bilaterally. Pt. Ready to progress to home program and return to sport.     Rehab Potential  Excellent    Clinical Impairments Affecting Rehab Potential   (-) s/p surgery, adherance to wearing sling, (+) desire to return to sport, age    PT Frequency  2x / week    PT Duration  6 weeks    PT Treatment/Interventions  ADLs/Self Care Home Management;Biofeedback;Cryotherapy;Electrical Stimulation;Iontophoresis 74m/ml Dexamethasone;Moist Heat;Ultrasound;Therapeutic activities;Functional mobility training;Therapeutic exercise;Neuromuscular re-education;Patient/family education;Manual techniques;Compression bandaging;Scar mobilization;Passive range of motion;Dry needling;Energy conservation;Splinting;Taping;Visual/perceptual remediation/compensation    PT Home Exercise Plan  see sheet    Consulted and Agree with Plan of Care  Patient       Patient will benefit from skilled therapeutic intervention in order to improve the following deficits and impairments:  Decreased knowledge of precautions, Decreased mobility, Decreased range of motion, Decreased safety awareness, Decreased strength, Decreased scar mobility, Hypomobility, Impaired UE functional use, Improper body mechanics, Postural dysfunction  Visit Diagnosis: Stiffness of left shoulder, not elsewhere classified  Muscle weakness (generalized)  Abnormal posture     Problem List Patient  Active Problem List   Diagnosis Date Noted  . History of arthroscopy of left shoulder 09/08/2016   MJanna Arch PT, DPT   MJanna Arch11/30/2018, 6:44 AM  [facility] [address] [address]Phone: [phone]  Fax:  [phone] Name: William SCHIPPERSMRN: [id number] of Birth: [birth year]

## 2018-09-10 DIAGNOSIS — M25561 Pain in right knee: Secondary | ICD-10-CM | POA: Diagnosis not present

## 2018-10-25 DIAGNOSIS — E039 Hypothyroidism, unspecified: Secondary | ICD-10-CM | POA: Diagnosis not present

## 2018-10-25 DIAGNOSIS — Z1389 Encounter for screening for other disorder: Secondary | ICD-10-CM | POA: Diagnosis not present

## 2023-11-18 ENCOUNTER — Ambulatory Visit: Admission: EM | Admit: 2023-11-18 | Discharge: 2023-11-18 | Disposition: A | Discharge status: Home / Self Care

## 2023-11-18 ENCOUNTER — Encounter: Payer: Self-pay | Admitting: Emergency Medicine

## 2023-11-18 DIAGNOSIS — H18891 Other specified disorders of cornea, right eye: Secondary | ICD-10-CM | POA: Diagnosis not present

## 2023-11-18 MED ORDER — AZELASTINE HCL 0.05 % OP SOLN: 1.0000 [drp] | Freq: Two times a day (BID) | OPHTHALMIC | 0 refills | Status: AC

## 2023-11-18 NOTE — ED Triage Notes (Addendum)
 Patient reports runny eyes, burning eyes, loss of vision and redness to right eye x 1 month. Patient has used Visine eye drops with no improvement. Patient has also has tear duct surgery on right eye as a child.

## 2023-11-18 NOTE — ED Provider Notes (Signed)
 [facility]  Note:  This document was prepared using Conservation officer, historic buildings and may include unintentional dictation errors.  MRN: [id number] DOB: 02-24-1996  Subjective:   William Humphrey is a 28 y.o. male presenting for right eye irritation, watering, burning, and slight blurred vision x 2 to 3 weeks.  Patient reports that burning and blurred vision are new symptoms that started just in the last 1 to 2 days.  Patient reports he has used Visine eyedrops intermittently some symptoms began but denies any improvement to symptoms.  Patient states that when he was younger he had surgery to open up a blocked tear duct in his right eye and was concerned that this may have contributed to current symptoms.  Patient denies any foreign body, eye trauma or injury.  Patient denies severe pain, pain with eye movement, purulent drainage from the eye, severe vision changes  No current facility-administered medications for this encounter.  Current Outpatient Medications:    azelastine  (OPTIVAR ) 0.05 % ophthalmic solution, Place 1 drop into the right eye 2 (two) times daily., Disp: 6 mL, Rfl: 0   Allergies  Allergen Reactions   No Known Allergies     No past medical history on file.   Past Surgical History:  Procedure Laterality Date   SHOULDER ARTHROSCOPY WITH LABRAL REPAIR Left 08/05/2016   Procedure: LEFT SHOULDER ARTHROSCOPY WITH LABRAL REPAIR;  Surgeon: Addie Glendia Hacker, MD;  Location: [facility] OR;  Service: Orthopedics;  Laterality: Left;   TEAR DUCT PROBING Right    TOOTH EXTRACTION  2008    History reviewed. No pertinent family history.  Social History   Tobacco Use   Smoking status: Never   Smokeless tobacco: Never  Vaping Use   Vaping status: Never Used  Substance Use Topics   Alcohol use: No   Drug use: No    ROS Refer to HPI for ROS details.  Objective:   Vitals: BP 134/84 (BP Location: Right Arm)   Pulse 76   Temp 98.4 F (36.9 C) (Oral)   Resp  18   SpO2 100%   Physical Exam Vitals and nursing note reviewed.  Constitutional:      General: He is not in acute distress.    Appearance: Normal appearance. He is well-developed. He is not ill-appearing or toxic-appearing.  HENT:     Head: Normocephalic.     Nose: Nose normal.  Eyes:     General: Lids are normal. Vision grossly intact. Gaze aligned appropriately. Allergic shiner present. No visual field deficit.       Right eye: No foreign body or discharge.        Left eye: No foreign body or discharge.     Extraocular Movements:     Right eye: Normal extraocular motion.     Left eye: Normal extraocular motion.     Conjunctiva/sclera:     Right eye: Right conjunctiva is not injected. No exudate or hemorrhage.    Left eye: Left conjunctiva is not injected. No exudate or hemorrhage. Cardiovascular:     Rate and Rhythm: Normal rate.  Pulmonary:     Effort: Pulmonary effort is normal. No respiratory distress.  Skin:    General: Skin is warm and dry.  Neurological:     General: No focal deficit present.     Mental Status: He is alert and oriented to person, place, and time.  Psychiatric:        Mood and Affect: Mood normal.  Behavior: Behavior normal.     Procedures  No results found for this or any previous visit (from the past 24 hours).  No results found.   Assessment and Plan :     Discharge Instructions       1. Corneal irritation of right eye (Primary) - Visual acuity screening completed in UC, findings within normal limits. - azelastine  (OPTIVAR ) 0.05 % ophthalmic solution; Place 1 drop into the right eye 2 (two) times daily.  Dispense: 6 mL; Refill: 0 - If vision changes continue, recommend follow-up with eye care specialist for vision exam and intraocular exam to rule out any underlying pathology. -Continue to monitor symptoms for any change in severity if there is any escalation of current symptoms or development of new symptoms follow-up in ER for  further evaluation and management.      Caston Coopersmith B Tae Vonada   Chaniah Cisse, [place] B, [place] 11/18/23 1039

## 2023-11-18 NOTE — Discharge Instructions (Signed)
  1. Corneal irritation of right eye (Primary) - Visual acuity screening completed in UC, findings within normal limits. - azelastine  (OPTIVAR ) 0.05 % ophthalmic solution; Place 1 drop into the right eye 2 (two) times daily.  Dispense: 6 mL; Refill: 0 - If vision changes continue, recommend follow-up with eye care specialist for vision exam and intraocular exam to rule out any underlying pathology. -Continue to monitor symptoms for any change in severity if there is any escalation of current symptoms or development of new symptoms follow-up in ER for further evaluation and management.

## 2024-03-01 ENCOUNTER — Encounter: Payer: Self-pay | Admitting: Emergency Medicine

## 2024-03-01 ENCOUNTER — Ambulatory Visit
Admission: EM | Admit: 2024-03-01 | Discharge: 2024-03-01 | Disposition: A | Discharge status: Home / Self Care | Attending: Emergency Medicine | Admitting: Emergency Medicine

## 2024-03-01 DIAGNOSIS — B349 Viral infection, unspecified: Secondary | ICD-10-CM | POA: Diagnosis not present

## 2024-03-01 LAB — POCT INFLUENZA A/B
Influenza A, POC: NEGATIVE
Influenza B, POC: NEGATIVE

## 2024-03-01 NOTE — Discharge Instructions (Signed)
Your symptoms today are most likely being caused by a virus and should steadily improve in time it can take up to 7 to 10 days before you truly start to see a turnaround however things will get better ? ?Flu negative  ?   ?You can take Tylenol and/or Ibuprofen as needed for fever reduction and pain relief. ?  ?For cough: honey 1/2 to 1 teaspoon (you can dilute the honey in water or another fluid).  You can also use guaifenesin and dextromethorphan for cough. You can use a humidifier for chest congestion and cough.  If you don't have a humidifier, you can sit in the bathroom with the hot shower running.    ?  ?For sore throat: try warm salt water gargles, cepacol lozenges, throat spray, warm tea or water with lemon/honey, popsicles or ice, or OTC cold relief medicine for throat discomfort. ?  ?For congestion: take a daily anti-histamine like Zyrtec, Claritin, and a oral decongestant, such as pseudoephedrine.  You can also use Flonase 1-2 sprays in each nostril daily. ?  ?It is important to stay hydrated: drink plenty of fluids (water, gatorade/powerade/pedialyte, juices, or teas) to keep your throat moisturized and help further relieve irritation/discomfort.  ?

## 2024-03-01 NOTE — ED Provider Notes (Signed)
 " [facility]    CSN: [id number] Arrival date & time: 03/01/24  9178      History   Chief Complaint Chief Complaint  Patient presents with   Fever   Cough   Sore Throat   Generalized Body Aches    HPI William Humphrey is a 28 y.o. male.    Patient presents for evaluation of fever, nasal ingestion, bilateral ear pain, sinus pain and pressure to the nose and forehead, sore throat, productive cough, shortness of breath with coughing and chest pain with coughing beginning 4 days ago.  Symptoms progressively improving.  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  Has attempted use of DayQuil and NyQuil.  Tolerable to feeding liquids but appetite is decreased.     History reviewed. No pertinent past medical history.  Patient Active Problem List   Diagnosis Date Noted   History of arthroscopy of left shoulder 09/08/2016    Past Surgical History:  Procedure Laterality Date   SHOULDER ARTHROSCOPY WITH LABRAL REPAIR Left 08/05/2016   Procedure: LEFT SHOULDER ARTHROSCOPY WITH LABRAL REPAIR;  Surgeon: Addie Glendia Hacker, MD;  Location: [facility] OR;  Service: Orthopedics;  Laterality: Left;   TEAR DUCT PROBING Right    TOOTH EXTRACTION  2008       Home Medications    Prior to Admission medications  Medication Sig Start Date End Date Taking? Authorizing Provider  azelastine  (OPTIVAR ) 0.05 % ophthalmic solution Place 1 drop into the right eye 2 (two) times daily. 11/18/23   Aurea Ethel NOVAK, NP    Family History History reviewed. No pertinent family history.  Social History Social History[1]   Allergies   No known allergies   Review of Systems Review of Systems  Constitutional:  Positive for fever. Negative for activity change, appetite change, chills, diaphoresis, fatigue and unexpected weight change.  HENT:  Positive for congestion, ear pain, sinus pressure, sinus pain and sore throat. Negative for dental problem, drooling, ear discharge, facial  swelling, hearing loss, mouth sores, nosebleeds, postnasal drip, rhinorrhea, sneezing, tinnitus, trouble swallowing and voice change.   Respiratory:  Positive for cough and shortness of breath. Negative for apnea, choking, chest tightness, wheezing and stridor.   Gastrointestinal: Negative.      Physical Exam Triage Vital Signs ED Triage Vitals  Encounter Vitals Group     BP 03/01/24 1011 122/85     Girls Systolic BP Percentile --      Girls Diastolic BP Percentile --      Boys Systolic BP Percentile --      Boys Diastolic BP Percentile --      Pulse Rate 03/01/24 1011 98     Resp 03/01/24 1011 20     Temp 03/01/24 1011 99.1 F (37.3 C)     Temp Source 03/01/24 1011 Oral     SpO2 03/01/24 1011 99 %     Weight --      Height --      Head Circumference --      Peak Flow --      Pain Score 03/01/24 1015 3     Pain Loc --      Pain Education --      Exclude from Growth Chart --    No data found.  Updated Vital Signs BP 122/85 (BP Location: Right Arm)   Pulse 98   Temp 99.1 F (37.3 C) (Oral)   Resp 20   SpO2 99%   Visual Acuity Right Eye Distance:  Left Eye Distance:   Bilateral Distance:    Right Eye Near:   Left Eye Near:    Bilateral Near:     Physical Exam Constitutional:      Appearance: Normal appearance.  HENT:     Right Ear: Tympanic membrane, ear canal and external ear normal.     Left Ear: Tympanic membrane, ear canal and external ear normal.     Nose: Congestion present.     Mouth/Throat:     Pharynx: Oropharyngeal exudate present. No posterior oropharyngeal erythema.  Cardiovascular:     Rate and Rhythm: Normal rate and regular rhythm.     Pulses: Normal pulses.     Heart sounds: Normal heart sounds.  Pulmonary:     Effort: Pulmonary effort is normal.     Breath sounds: Normal breath sounds.  Musculoskeletal:     Cervical back: Normal range of motion and neck supple.  Neurological:     Mental Status: He is alert and oriented to person,  place, and time. Mental status is at baseline.      UC Treatments / Results  Labs (all labs ordered are listed, but only abnormal results are displayed) Labs Reviewed  POCT INFLUENZA A/B - Normal    EKG   Radiology No results found.  Procedures Procedures (including critical care time)  Medications Ordered in UC Medications - No data to display  Initial Impression / Assessment and Plan / UC Course  I have reviewed the triage vital signs and the nursing notes.  Pertinent labs & imaging results that were available during my care of the patient were reviewed by me and considered in my medical decision making (see chart for details).  Viral illness  Patient is in no signs of distress nor toxic appearing.  Vital signs are stable.  Low suspicion for pneumonia, pneumothorax or bronchitis and therefore will defer imaging.  Flu test negative.May use additional over-the-counter medications as needed for supportive care.  May follow-up with urgent care as needed if symptoms persist or worsen.  Note given.   Final Clinical Impressions(s) / UC Diagnoses   Final diagnoses:  Viral illness  History of arthroscopy of left shoulder   Discharge Instructions   None    ED Prescriptions   None    PDMP not reviewed this encounter.     [1]  Social History Tobacco Use   Smoking status: Never   Smokeless tobacco: Never  Vaping Use   Vaping status: Never Used  Substance Use Topics   Alcohol use: No   Drug use: No     Teresa Shelba SAUNDERS, NP 03/01/24 1111  "

## 2024-03-01 NOTE — ED Triage Notes (Signed)
 Patient reports fevers of 101.0, bodyaches, cough, chest congestion and sore throat x 4 days. Patient has taken Dayquil and Nyquil with moderate relief. Rates bodyaches  3/10 and sore throat 3/10.
